# Patient Record
Sex: Male | Born: 1965
Health system: Southern US, Community
[De-identification: ages and names within clinical notes are randomized; demographics above are authoritative.]

## PROBLEM LIST (undated history)

## (undated) DIAGNOSIS — J45909 Unspecified asthma, uncomplicated: Secondary | ICD-10-CM

## (undated) DIAGNOSIS — I10 Essential (primary) hypertension: Secondary | ICD-10-CM

## (undated) DIAGNOSIS — Z9852 Vasectomy status: Secondary | ICD-10-CM

## (undated) DIAGNOSIS — M109 Gout, unspecified: Secondary | ICD-10-CM

## (undated) HISTORY — PX: VASECTOMY: SHX75

## (undated) HISTORY — PX: ANTERIOR CRUCIATE LIGAMENT REPAIR: SHX115

## (undated) HISTORY — PX: TONSILECTOMY, ADENOIDECTOMY, BILATERAL MYRINGOTOMY AND TUBES: SHX2538

## (undated) HISTORY — DX: Essential (primary) hypertension: I10

## (undated) HISTORY — DX: Unspecified asthma, uncomplicated: J45.909

## (undated) HISTORY — DX: Gout, unspecified: M10.9

## (undated) HISTORY — DX: Vasectomy status: Z98.52

---

## 1999-11-12 ENCOUNTER — Encounter: Payer: Self-pay | Admitting: Emergency Medicine

## 1999-11-12 ENCOUNTER — Emergency Department (HOSPITAL_COMMUNITY): Admission: EM | Admit: 1999-11-12 | Discharge: 1999-11-12 | Payer: Self-pay | Admitting: Emergency Medicine

## 2000-10-31 ENCOUNTER — Emergency Department (HOSPITAL_COMMUNITY): Admission: EM | Admit: 2000-10-31 | Discharge: 2000-10-31 | Payer: Self-pay | Admitting: Emergency Medicine

## 2005-06-02 ENCOUNTER — Emergency Department (HOSPITAL_COMMUNITY): Admission: EM | Admit: 2005-06-02 | Discharge: 2005-06-03 | Payer: Self-pay | Admitting: Emergency Medicine

## 2005-12-07 ENCOUNTER — Encounter: Admission: RE | Admit: 2005-12-07 | Discharge: 2005-12-07 | Payer: Self-pay | Admitting: Family Medicine

## 2005-12-07 ENCOUNTER — Ambulatory Visit: Payer: Self-pay | Admitting: Family Medicine

## 2005-12-17 ENCOUNTER — Ambulatory Visit: Payer: Self-pay | Admitting: Cardiology

## 2006-01-15 ENCOUNTER — Ambulatory Visit: Payer: Self-pay | Admitting: Internal Medicine

## 2006-01-15 ENCOUNTER — Ambulatory Visit: Payer: Self-pay

## 2006-02-05 ENCOUNTER — Ambulatory Visit: Payer: Self-pay | Admitting: Internal Medicine

## 2006-10-19 ENCOUNTER — Ambulatory Visit: Payer: Self-pay | Admitting: Family Medicine

## 2006-10-19 ENCOUNTER — Encounter (INDEPENDENT_AMBULATORY_CARE_PROVIDER_SITE_OTHER): Payer: Self-pay | Admitting: Family Medicine

## 2006-10-19 DIAGNOSIS — J309 Allergic rhinitis, unspecified: Secondary | ICD-10-CM | POA: Insufficient documentation

## 2006-10-21 ENCOUNTER — Telehealth (INDEPENDENT_AMBULATORY_CARE_PROVIDER_SITE_OTHER): Payer: Self-pay | Admitting: *Deleted

## 2006-11-24 ENCOUNTER — Ambulatory Visit: Payer: Self-pay | Admitting: Family Medicine

## 2006-11-24 LAB — CONVERTED CEMR LAB: Glucose, Bld: 86 mg/dL (ref 70–99)

## 2006-11-25 ENCOUNTER — Telehealth (INDEPENDENT_AMBULATORY_CARE_PROVIDER_SITE_OTHER): Payer: Self-pay | Admitting: *Deleted

## 2007-02-06 ENCOUNTER — Encounter (INDEPENDENT_AMBULATORY_CARE_PROVIDER_SITE_OTHER): Payer: Self-pay | Admitting: Family Medicine

## 2007-03-07 ENCOUNTER — Ambulatory Visit: Payer: Self-pay | Admitting: Family Medicine

## 2007-03-07 ENCOUNTER — Telehealth (INDEPENDENT_AMBULATORY_CARE_PROVIDER_SITE_OTHER): Payer: Self-pay | Admitting: Family Medicine

## 2007-03-09 ENCOUNTER — Telehealth (INDEPENDENT_AMBULATORY_CARE_PROVIDER_SITE_OTHER): Payer: Self-pay | Admitting: *Deleted

## 2007-04-01 ENCOUNTER — Ambulatory Visit: Payer: Self-pay | Admitting: Family Medicine

## 2007-05-24 ENCOUNTER — Ambulatory Visit: Payer: Self-pay | Admitting: Internal Medicine

## 2007-05-24 DIAGNOSIS — M109 Gout, unspecified: Secondary | ICD-10-CM | POA: Insufficient documentation

## 2007-05-24 HISTORY — DX: Gout, unspecified: M10.9

## 2007-05-26 ENCOUNTER — Encounter (INDEPENDENT_AMBULATORY_CARE_PROVIDER_SITE_OTHER): Payer: Self-pay | Admitting: *Deleted

## 2007-05-26 LAB — CONVERTED CEMR LAB
Basophils Relative: 0.5 % (ref 0.0–1.0)
Eosinophils Relative: 8.3 % — ABNORMAL HIGH (ref 0.0–5.0)
Hemoglobin: 15.3 g/dL (ref 13.0–17.0)
Lymphocytes Relative: 24.1 % (ref 12.0–46.0)
Monocytes Absolute: 0.7 10*3/uL (ref 0.2–0.7)
Neutro Abs: 5.5 10*3/uL (ref 1.4–7.7)
Platelets: 227 10*3/uL (ref 150–400)
Uric Acid, Serum: 8.2 mg/dL — ABNORMAL HIGH (ref 2.4–7.0)
WBC: 9.2 10*3/uL (ref 4.5–10.5)

## 2007-06-08 ENCOUNTER — Ambulatory Visit: Payer: Self-pay | Admitting: Internal Medicine

## 2007-06-08 DIAGNOSIS — R519 Headache, unspecified: Secondary | ICD-10-CM | POA: Insufficient documentation

## 2007-06-08 DIAGNOSIS — R51 Headache: Secondary | ICD-10-CM | POA: Insufficient documentation

## 2007-09-12 ENCOUNTER — Ambulatory Visit: Payer: Self-pay | Admitting: Internal Medicine

## 2008-04-06 ENCOUNTER — Emergency Department (HOSPITAL_COMMUNITY): Admission: EM | Admit: 2008-04-06 | Discharge: 2008-04-06 | Payer: Self-pay | Admitting: Emergency Medicine

## 2008-04-09 ENCOUNTER — Encounter (HOSPITAL_COMMUNITY): Admission: RE | Admit: 2008-04-09 | Discharge: 2008-07-08 | Payer: Self-pay | Admitting: Emergency Medicine

## 2008-07-10 ENCOUNTER — Emergency Department (HOSPITAL_COMMUNITY): Admission: EM | Admit: 2008-07-10 | Discharge: 2008-07-10 | Payer: Self-pay | Admitting: Emergency Medicine

## 2008-08-28 ENCOUNTER — Ambulatory Visit: Payer: Self-pay | Admitting: Family Medicine

## 2008-08-30 ENCOUNTER — Encounter (INDEPENDENT_AMBULATORY_CARE_PROVIDER_SITE_OTHER): Payer: Self-pay | Admitting: *Deleted

## 2008-08-30 LAB — CONVERTED CEMR LAB
AST: 18 units/L (ref 0–37)
Alkaline Phosphatase: 74 units/L (ref 39–117)
Basophils Absolute: 0 10*3/uL (ref 0.0–0.1)
Bilirubin, Direct: 0.1 mg/dL (ref 0.0–0.3)
Calcium: 9 mg/dL (ref 8.4–10.5)
Creatinine, Ser: 0.8 mg/dL (ref 0.4–1.5)
Eosinophils Absolute: 0.2 10*3/uL (ref 0.0–0.7)
GFR calc non Af Amer: 111.9 mL/min (ref >60)
Glucose, Bld: 78 mg/dL (ref 70–99)
HDL: 30.9 mg/dL — ABNORMAL LOW (ref >39.00)
Hemoglobin: 15.3 g/dL (ref 13.0–17.0)
Lymphocytes Relative: 23.5 % (ref 12.0–46.0)
Monocytes Relative: 6.4 % (ref 3.0–12.0)
Neutro Abs: 5.8 10*3/uL (ref 1.4–7.7)
Neutrophils Relative %: 67.2 % (ref 43.0–77.0)
Platelets: 212 10*3/uL (ref 150.0–400.0)
RDW: 13.8 % (ref 11.5–14.6)
Sodium: 143 meq/L (ref 135–145)
Total Bilirubin: 1.3 mg/dL — ABNORMAL HIGH (ref 0.3–1.2)
Triglycerides: 63 mg/dL (ref 0.0–149.0)
VLDL: 12.6 mg/dL (ref 0.0–40.0)

## 2009-03-11 ENCOUNTER — Ambulatory Visit: Payer: Self-pay | Admitting: Family

## 2009-03-13 ENCOUNTER — Ambulatory Visit: Payer: Self-pay | Admitting: Family

## 2010-02-03 ENCOUNTER — Ambulatory Visit: Payer: Self-pay | Admitting: Family Medicine

## 2010-02-03 DIAGNOSIS — R5381 Other malaise: Secondary | ICD-10-CM | POA: Insufficient documentation

## 2010-02-03 DIAGNOSIS — R5383 Other fatigue: Secondary | ICD-10-CM

## 2010-02-07 LAB — CONVERTED CEMR LAB
BUN: 14 mg/dL (ref 6–23)
Basophils Relative: 0.2 % (ref 0.0–3.0)
CO2: 25 meq/L (ref 19–32)
Chloride: 106 meq/L (ref 96–112)
Creatinine, Ser: 0.9 mg/dL (ref 0.4–1.5)
Eosinophils Absolute: 0.5 10*3/uL (ref 0.0–0.7)
Eosinophils Relative: 5.9 % — ABNORMAL HIGH (ref 0.0–5.0)
HCT: 44.3 % (ref 39.0–52.0)
Lymphs Abs: 2.7 10*3/uL (ref 0.7–4.0)
MCHC: 33.6 g/dL (ref 30.0–36.0)
MCV: 83 fL (ref 78.0–100.0)
Monocytes Absolute: 0.6 10*3/uL (ref 0.1–1.0)
Neutrophils Relative %: 56.3 % (ref 43.0–77.0)
Platelets: 207 10*3/uL (ref 150.0–400.0)
Potassium: 4.7 meq/L (ref 3.5–5.1)
TSH: 1.29 microintl units/mL (ref 0.35–5.50)

## 2010-03-28 ENCOUNTER — Telehealth: Payer: Self-pay | Admitting: Family Medicine

## 2010-03-28 ENCOUNTER — Encounter: Payer: Self-pay | Admitting: Family Medicine

## 2010-03-28 ENCOUNTER — Ambulatory Visit
Admission: RE | Admit: 2010-03-28 | Discharge: 2010-03-28 | Payer: Self-pay | Source: Home / Self Care | Attending: Family Medicine | Admitting: Family Medicine

## 2010-03-28 ENCOUNTER — Encounter (INDEPENDENT_AMBULATORY_CARE_PROVIDER_SITE_OTHER): Payer: Self-pay | Admitting: *Deleted

## 2010-03-28 ENCOUNTER — Other Ambulatory Visit: Payer: Self-pay | Admitting: Family Medicine

## 2010-03-28 DIAGNOSIS — D485 Neoplasm of uncertain behavior of skin: Secondary | ICD-10-CM | POA: Insufficient documentation

## 2010-03-28 HISTORY — DX: Neoplasm of uncertain behavior of skin: D48.5

## 2010-03-28 LAB — CBC WITH DIFFERENTIAL/PLATELET
Basophils Absolute: 0 10*3/uL (ref 0.0–0.1)
Basophils Relative: 0.5 % (ref 0.0–3.0)
Eosinophils Absolute: 0.3 10*3/uL (ref 0.0–0.7)
Eosinophils Relative: 3.4 % (ref 0.0–5.0)
HCT: 45.3 % (ref 39.0–52.0)
Hemoglobin: 15.1 g/dL (ref 13.0–17.0)
Lymphocytes Relative: 26.1 % (ref 12.0–46.0)
Lymphs Abs: 2.6 10*3/uL (ref 0.7–4.0)
MCHC: 33.3 g/dL (ref 30.0–36.0)
MCV: 82.6 fl (ref 78.0–100.0)
Monocytes Absolute: 0.8 10*3/uL (ref 0.1–1.0)
Monocytes Relative: 7.7 % (ref 3.0–12.0)
Neutro Abs: 6.2 10*3/uL (ref 1.4–7.7)
Neutrophils Relative %: 62.3 % (ref 43.0–77.0)
Platelets: 251 10*3/uL (ref 150.0–400.0)
RBC: 5.49 Mil/uL (ref 4.22–5.81)
RDW: 13.2 % (ref 11.5–14.6)
WBC: 10 10*3/uL (ref 4.5–10.5)

## 2010-03-28 LAB — HEPATIC FUNCTION PANEL
ALT: 22 U/L (ref 0–53)
AST: 18 U/L (ref 0–37)
Albumin: 3.9 g/dL (ref 3.5–5.2)
Alkaline Phosphatase: 84 U/L (ref 39–117)
Bilirubin, Direct: 0.2 mg/dL (ref 0.0–0.3)
Total Bilirubin: 1.3 mg/dL — ABNORMAL HIGH (ref 0.3–1.2)
Total Protein: 6.1 g/dL (ref 6.0–8.3)

## 2010-03-28 LAB — LIPID PANEL
Cholesterol: 137 mg/dL (ref 0–200)
HDL: 25 mg/dL — ABNORMAL LOW (ref >39.00)
LDL Cholesterol: 98 mg/dL (ref 0–99)
Total CHOL/HDL Ratio: 5
Triglycerides: 69 mg/dL (ref 0.0–149.0)
VLDL: 13.8 mg/dL (ref 0.0–40.0)

## 2010-03-28 LAB — BASIC METABOLIC PANEL
BUN: 19 mg/dL (ref 6–23)
CO2: 28 mEq/L (ref 19–32)
Calcium: 9.2 mg/dL (ref 8.4–10.5)
Chloride: 104 mEq/L (ref 96–112)
Creatinine, Ser: 0.9 mg/dL (ref 0.4–1.5)
GFR: 100.84 mL/min (ref >60.00)
Glucose, Bld: 77 mg/dL (ref 70–99)
Potassium: 4.8 mEq/L (ref 3.5–5.1)
Sodium: 141 mEq/L (ref 135–145)

## 2010-03-28 LAB — TSH: TSH: 0.99 u[IU]/mL (ref 0.35–5.50)

## 2010-03-28 LAB — PSA: PSA: 0.37 ng/mL (ref 0.10–4.00)

## 2010-03-31 ENCOUNTER — Telehealth (INDEPENDENT_AMBULATORY_CARE_PROVIDER_SITE_OTHER): Payer: Self-pay | Admitting: *Deleted

## 2010-04-01 ENCOUNTER — Encounter (INDEPENDENT_AMBULATORY_CARE_PROVIDER_SITE_OTHER): Payer: Self-pay | Admitting: *Deleted

## 2010-04-24 NOTE — Letter (Signed)
Summary: Primary Care Consult Scheduled Letter  [facility]  [address] [place], [place] [address]   Phone: [phone]  Fax: [phone]      04/01/2010 MRN: [id number]  [name] [address] [place], [place]  [address]    Dear Mr. STARON,    We have scheduled an appointment for you.  At the recommendation of Dr. Neena Rhymes, we have scheduled you a consult with Dr. Betsy [facility] on 04-17-2010 at 10:10am.  Their address is [address], [place] [address]. The office phone number is [phone].  If this appointment day and time is not convenient for you, please feel free to call the office of the doctor you are being referred to at the number listed above and reschedule the appointment.    It is important for you to keep your scheduled appointments. We are here to make sure you are given good patient care.   Thank you,    Renee, Patient Care Coordinator [facility]    ****IF YOU ARE UNABLE TO KEEP THIS APPOINTMENT, PLEASE GIVE [facility] A 24 HOUR NOTICE TO AVOID ANY FEE****

## 2010-04-24 NOTE — Assessment & Plan Note (Signed)
Summary: cpx//lch   Vital Signs:  Patient profile:   45 year old male Height:      71 inches Weight:      214 pounds BMI:     29.95 Pulse rate:   74 / minute BP sitting:   104 / 60  (left arm)  Vitals Entered By: Doristine Devoid CMA (March 28, 2010 8:02 AM) CC: CPX AND LAB   History of Present Illness: 45 yo man here today for CPE.  currently has sinus infxn- tx'd at [facility] on 03/23/10, got amox.  no concerns about health today.  Preventive Screening-Counseling & Management  Alcohol-Tobacco     Alcohol drinks/day: <1     Smoking Status: never  Caffeine-Diet-Exercise     Does Patient Exercise: yes     Type of exercise: hiking      Sexual History:  currently monogamous.        Drug Use:  never.    Current Medications (verified): 1)  Symbicort 80-4.5 Mcg/act  Aero (Budesonide-Formoterol Fumarate) .... 2 Puff Bid 2)  Claritin 10 Mg  Tabs (Loratadine) .Marland Kitchen.. 1 By Mouth Once Daily As Needed 3)  Ventolin Hfa 108 (90 Base) Mcg/act Aers (Albuterol Sulfate) .... 2 Puffs Q4 As Needed For Wheezing or Cough  Allergies (verified): No Known Drug Allergies  Past History:  Past medical, surgical, family and social histories (including risk factors) reviewed, and no changes noted (except as noted below).  Past Medical History: Reviewed history from 09/12/2007 and no changes required. Allergic rhinitis Mild COPD Gout   Past Surgical History: Reviewed history from 09/12/2007 and no changes required. Tonsillectomy Knee surgery x 2  (L)  Family History: Reviewed history from 08/28/2008 and no changes required. CAD - MGF (MI age 39's) HTN - M family DM - M family stroke - no colon Ca -uncle (dx'd in 25s) prostate Ca - none  Social History: Reviewed history from 02/03/2010 and no changes required. 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  The patient  denies anorexia, fever, weight loss, weight gain, vision loss, decreased hearing, hoarseness, chest pain, syncope, dyspnea on exertion, peripheral edema, prolonged cough, abdominal pain, melena, hematochezia, severe indigestion/heartburn, suspicious skin lesions, depression, abnormal bleeding, enlarged lymph nodes, and testicular masses.         HAs occur w/ stress 1 episode of hematuria w/ dysuria but sxs improved spontaneously- Oct 2011  Physical Exam  General:  Well-developed,well-nourished,in no acute distress; alert,appropriate and cooperative throughout examination Head:  Normocephalic and atraumatic without obvious abnormalities. No apparent alopecia or balding. Eyes:  PERRL, EOMI, fundi WNL Ears:  External ear exam shows no significant lesions or deformities.  Otoscopic examination reveals clear canals, tympanic membranes are intact bilaterally without bulging, retraction, inflammation or discharge. Hearing is grossly normal bilaterally. Nose:  External nasal examination shows no deformity or inflammation. Nasal mucosa are pink and moist without lesions or exudates. Mouth:  Oral mucosa and oropharynx without lesions or exudates.  Teeth in good repair. Neck:  No deformities, masses, or tenderness noted. Lungs:  Normal respiratory effort, chest expands symmetrically. Lungs are clear to auscultation, no crackles or wheezes. Heart:  Normal rate and regular rhythm. S1 and S2 normal without gallop, murmur, click, rub or other extra sounds. Abdomen:  Bowel sounds positive,abdomen soft and non-tender without masses, organomegaly or hernias noted. Rectal:  No external abnormalities noted.  Normal sphincter tone. No rectal masses or tenderness. Genitalia:  Testes bilaterally descended without nodularity, tenderness or masses. No scrotal masses or lesions. No penis lesions or urethral discharge. No inguinal hernias noted Prostate:  Prostate gland firm and smooth, no enlargement, nodularity,  tenderness, mass, asymmetry or induration. Pulses:  +2 carotid, radial, DP Extremities:  No clubbing, cyanosis, edema, or deformity noted with normal full range of motion of all joints.   Neurologic:  No cranial nerve deficits noted. Station and gait are normal. Plantar reflexes are down-going bilaterally. DTRs are symmetrical throughout. Sensory, motor and coordinative functions appear intact. Skin:  abnormally shaped mole on L back and irregularly pigmented mole on lumbar spine Cervical Nodes:  No lymphadenopathy noted Inguinal Nodes:  No significant adenopathy Psych:  Cognition and judgment appear intact. Alert and cooperative with normal attention span and concentration. No apparent delusions, illusions, hallucinations   Impression & Recommendations:  Problem # 1:  PHYSICAL EXAMINATION (ICD-V70.0) Assessment Unchanged PE WNL w/ exception of irregular moles.  check labs.  EKG done as baseline.  anticipatory guidance provided. Orders: Venipuncture ([address]) TLB-Lipid Panel (80061-LIPID) TLB-BMP (Basic Metabolic Panel-BMET) (80048-METABOL) TLB-CBC Platelet - w/Differential (85025-CBCD) TLB-Hepatic/Liver Function Pnl (80076-HEPATIC) TLB-TSH (Thyroid Stimulating Hormone) (84443-TSH) TLB-PSA (Prostate Specific Antigen) (84153-PSA) EKG w/ Interpretation (93000)  Problem # 2:  NEOPLASM, SKIN, UNCERTAIN BEHAVIOR (ICD-238.2) Assessment: New refer to derm for evaluation and tx. Orders: Dermatology Referral (Derma)  Problem # 3:  NEOPLASM, MALIGNANT, COLON, FAMILY HX (ICD-V16.0) Assessment: New refer to GI Orders: Gastroenterology Referral (GI)  Complete Medication List: 1)  Symbicort 80-4.5 Mcg/act Aero (Budesonide-formoterol fumarate) .... 2 puff bid 2)  Claritin 10 Mg Tabs (Loratadine) .Marland Kitchen.. 1 by mouth once daily as needed 3)  Ventolin Hfa 108 (90 Base) Mcg/act Aers (Albuterol sulfate) .... 2 puffs q4 as needed for wheezing or cough  Patient Instructions: 1)  Follow up in 1 year or  as needed 2)  Your exam looks great- keep up the good work! 3)  We'll notify you of your lab results 4)  Someone will call you with your derm and GI appts 5)  Call with any questions or concerns 6)  Happy New Year!!   Orders Added: 1)  Venipuncture [36415] 2)  TLB-Lipid Panel [80061-LIPID] 3)  TLB-BMP (Basic Metabolic Panel-BMET) [80048-METABOL] 4)  TLB-CBC Platelet - w/Differential [85025-CBCD] 5)  TLB-Hepatic/Liver Function Pnl [80076-HEPATIC] 6)  TLB-TSH (Thyroid Stimulating Hormone) [84443-TSH] 7)  TLB-PSA (Prostate Specific Antigen) [60454-UJW] 8)  Dermatology Referral [Derma] 9)  Gastroenterology Referral [GI] 10)  Est. Patient 40-64 years [99396] 11)  EKG w/ Interpretation [93000]

## 2010-04-24 NOTE — Assessment & Plan Note (Signed)
Summary: headaches//lch  Flu Vaccine Consent Questions     Do you have a history of severe allergic reactions to this vaccine? no    Any prior history of allergic reactions to egg and/or gelatin? no    Do you have a sensitivity to the preservative Thimersol? no    Do you have a past history of Guillan-Barre Syndrome? no    Do you currently have an acute febrile illness? no    Have you ever had a severe reaction to latex? no    Vaccine information given and explained to patient? yes    Are you currently pregnant? no    Lot Number:[id number]   Exp Date:09/20/2010   Site Given  Right Deltoid IM    Vital Signs:  Patient profile:   45 year old male Weight:      223 pounds BMI:     31.21 Temp:     98.1 degrees F oral BP sitting:   120 / 70  (right arm)  Vitals Entered By: Doristine Devoid CMA (February 03, 2010 9:06 AM) CC: HA x2 wks not sure if it's sinuses, hit head around that time could be from that   History of Present Illness: 45 yo man here today for HA.  sxs started 3 weeks ago.  hit top of head getting into the truck- had pain for a few days and then started having daily HAs.  took sinus meds w/out relief.  started restricting food intake in hopes of weight loss- HAs worsened.  hasn't had HA since Thursday.  denied visual changes w/ HA, photo or phonophobia, focal weakness or numbness.  fatigue- sleeping 6-8 hrs nightly but having daytime fatigue.  finding himself nodding off while watching TV in the evening.  wife just finished cancer tx.  having concerns about money.  admits to increased stress.  + family hx of DM.    Current Medications (verified): 1)  Symbicort 80-4.5 Mcg/act  Aero (Budesonide-Formoterol Fumarate) .... 2 Puff Bid 2)  Claritin 10 Mg  Tabs (Loratadine) .Marland Kitchen.. 1 By Mouth Once Daily As Needed 3)  Ventolin Hfa 108 (90 Base) Mcg/act Aers (Albuterol Sulfate) .... 2 Puffs Q4 As Needed For Wheezing or Cough  Allergies (verified): No Known Drug Allergies  Past  History:  Past medical, surgical, family and social histories (including risk factors) reviewed, and no changes noted (except as noted below).  Past Medical History: Reviewed history from 09/12/2007 and no changes required. Allergic rhinitis Mild COPD Gout   Past Surgical History: Reviewed history from 09/12/2007 and no changes required. Tonsillectomy Knee surgery x 2  (L)  Family History: Reviewed history from 08/28/2008 and no changes required. CAD - MGF (MI age 1's) HTN - M family DM - M family stroke - no colon Ca -uncle (dx'd in 37s) prostate Ca - none  Social History: Reviewed history from 03/11/2009 and no changes required. 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 No apparent alopecia or balding.  no TTP over sinuses Eyes:  PERRL, EOMI, fundi WNL Ears:  External ear exam shows no significant lesions or deformities.  Otoscopic examination reveals clear canals, tympanic membranes are intact bilaterally without bulging, retraction, inflammation or discharge. Hearing is grossly normal bilaterally. Nose:  External nasal examination shows no deformity or inflammation. Nasal mucosa are  pink and moist without lesions or exudates. Mouth:  Oral mucosa and oropharynx without lesions or exudates.  Teeth in good repair. Neck:  No deformities, masses, or tenderness noted. Lungs:  Normal respiratory effort, chest expands symmetrically. Lungs are clear to auscultation, no crackles or wheezes. Heart:  Normal rate and regular rhythm. S1 and S2 normal without gallop, murmur, click, rub or other extra sounds. Psych:  Oriented X3, memory intact for recent and remote, normally interactive, and good eye contact.     Impression &  Recommendations:  Problem # 1:  HEADACHE (ICD-784.0) Assessment Unchanged pt has not had HA for last 5 days.  ? whether pt had concussion after hitting head on truck.  pt asymptomatic today.  if HA returns have low threshold for head CT.  will follow.  Problem # 2:  FATIGUE (ICD-780.79) Assessment: New check labs to r/o anemia, thyroid dysfxn, DM (given family hx).  discussed w/ pt that if lab work all normal that we must consider depression as a diagnosis, especially w/ all that is going on (wife's cancer, money concerns, stress at work).  will follow closely. Orders: Venipuncture ([address]) Specimen Handling (60454) TLB-BMP (Basic Metabolic Panel-BMET) (80048-METABOL) TLB-CBC Platelet - w/Differential (85025-CBCD) TLB-TSH (Thyroid Stimulating Hormone) (84443-TSH)  Complete Medication List: 1)  Symbicort 80-4.5 Mcg/act Aero (Budesonide-formoterol fumarate) .... 2 puff bid 2)  Claritin 10 Mg Tabs (Loratadine) .Marland Kitchen.. 1 by mouth once daily as needed 3)  Ventolin Hfa 108 (90 Base) Mcg/act Aers (Albuterol sulfate) .... 2 puffs q4 as needed for wheezing or cough  Other Orders: Admin 1st Vaccine (09811) Flu Vaccine 85yrs + (91478)  Patient Instructions: 1)  Follow up as scheduled for your physical 2)  We'll notify you of your lab work 3)  If we don't identify any causes of fatigue on blood work we need to think about depression as a cause 4)  If you again have headache- please call me.  We will get a CT scan of your head 5)  Call with any questions or concerns 6)  Hang in there!! Prescriptions: SYMBICORT 80-4.5 MCG/ACT  AERO (BUDESONIDE-FORMOTEROL FUMARATE) 2 puff Bid  #1 x 3   Entered and Authorized by:   Neena Rhymes MD   Signed by:   Neena Rhymes MD on 02/03/2010   Method used:   Electronically to        [facility] [id number]* (retail)       [address]       [place], [place]  [address]       Ph: [phone]       Fax: [phone]    RxID:   [id number] VENTOLIN HFA 108 (90 BASE) MCG/ACT AERS (ALBUTEROL SULFATE) 2 puffs Q4 as needed for wheezing or cough  #1 x 3   Entered and Authorized by:   Neena Rhymes MD   Signed by:   Neena Rhymes MD on 02/03/2010   Method used:   Electronically to        [id number]* (retail)       [address]       [place], [place]  [address]       Ph: [phone]       Fax: [phone]   RxID:   [id number]    Orders Added: 1)  Venipuncture [70623] 2)  Specimen Handling [99000] 3)  Admin 1st Vaccine [76283]  4)  Flu Vaccine 73yrs + [90658] 5)  TLB-BMP (Basic Metabolic Panel-BMET) [80048-METABOL] 6)  TLB-CBC Platelet - w/Differential [85025-CBCD] 7)  TLB-TSH (Thyroid Stimulating Hormone) [84443-TSH] 8)  Est. Patient Level III [44010]

## 2010-04-24 NOTE — Progress Notes (Addendum)
Summary: GASTRO REFERRAL  Phone Note Outgoing Call   Call placed by: Magdalen Spatz [facility],  March 28, 2010 10:12 AM Call placed to: Patient Summary of Call: IN REFERENCE TO GASTRO REFERRAL, I CALLED & S/W PT WHO STATES HE DOESN'T WANT THIS REFERRAL RIGHT NOW, WILL PROBABLY WAIT UNTIL NEXT YEAR Magdalen Spatz [facility]  March 28, 2010 10:12 AM   Follow-up for Phone Call        noted Follow-up by: Neena Rhymes MD,  March 28, 2010 10:38 AM     Appended Document: GASTRO REFERRAL patient called back wasn't sure if he needed to go for colonoscopy informed patient that he does due to family hx and patient ok'd information and says that he would call back around the middle of year to get prescription sent up

## 2010-04-24 NOTE — Progress Notes (Signed)
Summary: Change CPAP setting- defer to PCP   Phone Note Call from Patient Call back at [phone]   Caller: Patient Summary of Call: Dr Sherene Sires followed patient when he had hip replaced in August.  Patient feels CPAP machine is set way too high at 2.5, and patient wants it lowered to 1.5.  He wants Dr Sherene Sires to order [facility] to change setting.  [facility] [phone].  Patient wants call back today for feedback. Initial call taken by: Leonette Monarch,  March 31, 2010 1:20 PM  Follow-up for Phone Call        needs to get this through PCP or whoever started CPAP- he was never seen here! LMTCB  Vernie Murders  March 31, 2010 2:24 PM  Spoke with pt.  He is c/o pressure settings too high on his CPAP machine.  I advised that he needs to call and discuss this with his PCP and discuss possible sleep eval? I advised that we can not just change his CPAP settings without seeing him.  He states "I will just go ahead and unplug the machine".  I advised that again, he needs to call his PCP.  Pt hung up the phone. Follow-up by: Vernie Murders,  March 31, 2010 3:13 PM

## 2010-04-24 NOTE — Letter (Signed)
Summary: Pre Visit Letter Revised  [facility]  [address] [place], [place] [address]   Phone: [phone]  Fax: [phone]        03/28/2010 MRN: [id number] [name] [address] [place], [place]  [address]             Procedure Date:  May 01, 2010   dir col-Dr Nedra Hai to the Gastroenterology Division at [facility].    You are scheduled to see a nurse for your pre-procedure visit on April 17, 2010 at 9:30am on the 3rd floor at [facility], [address]  We ask that you try to arrive at our office 15 minutes prior to your appointment time to allow for check-in.  Please take a minute to review the attached form.  If you answer "Yes" to one or more of the questions on the first page, we ask that you call the person listed at your earliest opportunity.  If you answer "No" to all of the questions, please complete the rest of the form and bring it to your appointment.    Your nurse visit will consist of discussing your medical and surgical history, your immediate family medical history, and your medications.   If you are unable to list all of your medications on the form, please bring the medication bottles to your appointment and we will list them.  We will need to be aware of both prescribed and over the counter drugs.  We will need to know exact dosage information as well.    Please be prepared to read and sign documents such as consent forms, a financial agreement, and acknowledgement forms.  If necessary, and with your consent, a friend or relative is welcome to sit-in on the nurse visit with you.  Please bring your insurance card so that we may make a copy of it.  If your insurance requires a referral to see a specialist, please bring your referral form from your primary care physician.  No co-pay is required for this nurse visit.     If you cannot keep your appointment, please call [phone] to cancel or reschedule prior  to your appointment date.  This allows [place] the opportunity to schedule an appointment for another patient in need of care.    Thank you for choosing [facility] for your medical needs.  We appreciate the opportunity to care for you.  Please visit [place] at our website  to learn more about our practice.  Sincerely, The [facility]astroenterology Division

## 2010-08-08 NOTE — Assessment & Plan Note (Signed)
HEALTHCARE                               PULMONARY OFFICE NOTE   NAME:[name], [name]                       MRN:          [id number]  DATE:01/15/2006                            DOB:          December 09, 1965    PROBLEM:  This is a 45 year old nonsmoking white male referred through the  courtesy of Dr. Blossom Hoops complaining about problems with my breathing.  He describes episodic dyspnea over the past two months with a sensation that  he cannot take a comfortable deep breath.  He has had no history of asthma  and has not had any previous recognized respiratory discomfort or complaint.  He was sent for a cardiology workup, that may have been by Dr. Myrtis Ser by the  patient's description.  The patient says that office exam was negative so  the patient cancelled an echocardiogram that the doctor had scheduled.  He  felt better over the last two weekends, making trips up to [place], where he  felt his shortness of breath walking around was consistent with his level of  fitness.  He blames allergies for the fact that he has been more short of  breath since he came back to [place].  He does have a history of seasonal  rhinitis.  He had been given Prevacid which may help some, recognizing a  very occasional episode of heartburn.  There has been a little cough and  wheeze, bringing up small amounts of white sputum, but he has not had fever,  purulent discharge, blood, chest pain, or palpitation.  He notices a little  wheeze lying down at night.  His wife complains of his snoring but this is  not new.  He has not recognized dyspnea related to any work exposure.  He  has been active recently, referring to scouting camping trips, putting up  tents, etc., and says he is on his feet most of the time.  About a week ago  he developed warm tenderness in the left knee which he treated with  ibuprofen.  He has some history of gout which has only involved a toe in the  past.   Over the last 24 hours he has had distinct edema of the left lower  leg with a tight feeling.  He had had some cramping sensation in the left  thigh early in the week.  He has an appointment scheduled for allergy  evaluation with Dr. Stevphen Rochester, not having realized that evaluation  could be done here if needed.   MEDICATIONS:  Prevacid.   ALLERGIES:  NO MEDICATION ALLERGY.   REVIEW OF SYSTEMS:  Weight has been stable.  Some cough with scant white  sputum, wheeze especially when supine, dyspnea mainly characterized as a  sensation that he cannot take a comfortable deep breath as her HPI.  Snoring  with witnessed apneas.  He states he is always tired but says that is  because of his pressured lifestyle with nonrestorative sleep.  No headache  or syncope.  No exertional chest pain.  No adenopathy or rash.   PAST HISTORY:  1.  Hypertension.  2. Seasonal allergic rhinitis.  No history of clotting disorder, heart disease, diabetes, cancer.   PAST SURGERIES:  1. Left ACL.  2. Tonsillectomy.   SOCIAL HISTORY:  Never smoked.  Rare social alcohol.  Married with three  children.  He works in [employer], goes to college full-time, is  involved with scouts.   FAMILY HISTORY:  Son and wife both have asthma but nobody in his family has  asthma.  Others in the family with diabetes and heart disease.   OBJECTIVE:  VITAL SIGNS:  Weight 272 pounds.  Blood pressure 112/82.  Pulse  regular, 61.  Room air saturation 97%.  GENERAL:  This is an obese man with rather pressured speech.  Otherwise,  seems a little tired.  SKIN:  No rash.  ADENOPATHY:  None found.  HEENT:  Palate length 3/4.  Moderate nasal congestion bilaterally with clear  secretion.  No pharyngeal erythema.  NECK:  No neck vein distention, stridor, or thyromegaly.  CHEST:  Quiet, clear breath sounds.  I do not hear rales, rubs, or wheeze.  He is not coughing and breathing is not labored.  HEART:  Regular rhythm.   Normal S1, S2.  No murmur or gallop.  ABDOMEN:  Softly obese.  No definite organomegaly.  EXTREMITIES:  Left knee is distinctly warm and slightly reddened.  There is  definite edema of the left lower leg.  The calf is nontender.  Homan's is  negative.  There is a blanching superficial varix on the left pretibial  area.  No cyanosis or clubbing.   IMPRESSION:  1. Nonspecific dyspnea over the past two months with the sensation that he      cannot take a deep breath.  Normal oxygenation, nonspecific.  NOTE:  A      chest x-ray report dated December 07, 2005, was negative.  We will      want pulmonary function tests.  2. Arthritis, left knee, with a history of gout in the toe, suggesting      this may be arthritis.  3. Edema, left lower leg, very high risk for deep vein thrombosis inviting      the possibility that his episodic dyspnea over the last 2 months has      been because of recurrent pulmonary embolism.  I have discussed this      with the patient and we are pushing to get CT, Dopplers, and a D-DIMER      done today to exclude this possibility.  4. I think seasonal allergic rhinitis which can be evaluated here for      allergy evaluation as needed or if he prefers he can keep his      appointment to be evaluated elsewhere.  5. Probable obstructive sleep apnea.  6. Poor sleep hygiene with inadequate sleep.   PLAN:  1. The first priority is to exclude deep vein thrombosis and pulmonary      embolism so we are getting blood drawn for a CBC with diff, basic      metabolic panel, and D-DIMER; scheduling Dopplers of leg veins and a      contrast CT to exclude pulmonary embolism.  These are on schedule to be      done today.  2. While blood is being drawn, we will check a uric acid level for      possibility that he has acute gouty arthritis, left knee.  3. The CT scan of the chest will also  help with evaluation of his dyspnea     but we are scheduling pulmonary function tests.  4.  Schedule return here one week.  Less acute problems can be sorted out      secondarily.   I appreciate the chance to meet this gentleman.     Clinton D. Maple Hudson, MD, [facility], FACP    CDY/MedQ  DD: 01/15/2006  DT: 01/16/2006  Job #: [id number]   cc:   Leanne Chang, M.D.

## 2010-08-08 NOTE — Assessment & Plan Note (Signed)
[facility]                          [place] OFFICE NOTE   NAME:[name]                       MRN:          [id number]  DATE:12/07/2005                            DOB:          1966/01/21    REASON FOR VISIT:  Trouble breathing.   Mr. [name] is a 45 year old male who reports that over the last 2-3 months he  has been having difficulties with dyspnea on exertion.  He reports that when  he goes up the steps, by the time he gets to the last level he gets very  winded.  He has noticed shortness of breath after eating as well.  He  reports that he is not physically active but has never had this in the past.  The last couple days he has been having a cough and congestion but relates  that to allergies which he started taking Claritin p.r.n.  Upon further  questioning the patient did report intermittent episodes of chest pain.  He  described it as sharp, can last several hours.  It has not affected him from  doing his usual activity.  He denied any diaphoresis, numbness, tingling or  associated shortness of breath with the episodes.   In regards to his shortness of breath associated with eating, Mr. [name]  reports that he feels like his food does not go completely down, it stops in  mid chest.  After several hours the symptoms resolve.   PAST MEDICAL HISTORY:  None.   SURGICAL HISTORY:  1. Tonsillectomy.  2. Cartilage repair of the left knee.  3. ACL repair of the left knee.   MEDICATIONS:  None, except for Claritin p.r.n.   ALLERGIES:  No known drug allergies.   FAMILY HISTORY:  Father alive with a history of hypertension.  Mother alive  with a history of type 2 diabetes.  He has two brothers who are alive and  well with no significant medical problems.   SOCIAL HISTORY:  Patient works in [employer].  Married with 3  children.  Denies any tobacco use and drinks alcohol very rarely.   REVIEW OF SYSTEMS:  As per HPI,  otherwise unremarkable.   OBJECTIVE:  VITAL SIGNS:  Weight 271.  Temperature 98.3.  Pulse 72.  Blood  pressure 110/66. Pulse oximetry of 96% on room air.  GENERAL:  We have a pleasant over weight male in no acute distress.  Answers  questions appropriately.  Alert and oriented x3.  HEENT:  Nasal mucosa slightly boggy with clear nasal discharge.  Oropharynx  is benign.  NECK:  Was supple.  No lymphadenopathy, carotid bruits, no JVD, no  thyromegaly or nodules noted.  LUNGS:  Clear with good air movement.  HEART:  With regular rate and rhythm.  Normal S1 and S2.  No murmurs,  gallops or rubs heard on my examination.  EXTREMITIES:  No cyanosis, clubbing or edema.   EKG:  Showed normal sinus rhythm with no ST elevations or depression. No Q  waves.  No PVCs or PACs.  No LVH or RBH noted.   IMPRESSION:  A 45 year old  male presenting with 2-3 months history of  dyspnea on exertion and shortness of breath after eating.  This has been  complicated with intermittent chest pain and fatigue.  Etiology includes  deconditioning, cardiovascular disease, sleep apnea and pulmonary disease,  although unlikely given the history of no asthma or tobacco use.   PLAN:  1. We will refer patient to cardiology for further assessment.  2. We will obtain a chest x-ray, CBC, comprehensive metabolic profile, TSH      as well as an H. pylori.  3. We will refer patient for a sleep study.  4. We will start patient empirically on Prevacid 30 mg q. a.m.  5. Reviewed diet changes with patient.  6. He needs to follow up with me in 4 weeks or sooner if symptoms worsen.  7. I agreed with patient using Claritin daily.                                   Leanne Chang, M.D.   LA/MedQ  DD:  12/07/2005  DT:  12/07/2005  Job #:  [id number]

## 2010-08-08 NOTE — Assessment & Plan Note (Signed)
[facility]                              CARDIOLOGY OFFICE NOTE   NAME:Ganci, KESTON SEEVER                       MRN:          [id number]  DATE:12/17/2005                            DOB:          25-Apr-1965    Mr. Paquette is evaluated for his shortness of breath. He is active. He is  significantly overweight. He has noted shortness of breath at various times.  If he walks he has some shortness of breath. He has not had any definite PND  or orthopnea. He also may feel short of breath somewhat just after eating.  He has not had syncope or presyncope. He has not had a cough. He does snore  and there is question of sleep apnea. He is referred for further evaluation.   ALLERGIES:  No known drug allergies.   MEDICATIONS:  1. Prevacid started recently.   PAST MEDICAL HISTORY:  Other medical problems:  See the list below.   SOCIAL HISTORY:  The patient works.   FAMILY HISTORY:  There is no strong family history of coronary disease.   REVIEW OF SYSTEMS:  The patient has some allergic symptoms. He does have  shortness of breath as mentioned. He also has some fatigue. Otherwise his  review of systems is negative.   PHYSICAL EXAMINATION:  VITAL SIGNS:  The patient weighs 272 pounds. His  blood pressure is 140/87. His resting pulse is 95.  LUNGS:  Clear. Respiratory effort is not labored.  HEENT:  Reveals no xanthelasma. He has normal extraocular motion.  NECK:  There are no carotid bruits. There is no jugular venous distention.  CARDIAC:  Exam reveals an S1 and S2. There are no clicks or significant  murmurs.  ABDOMEN:  Obese. There are no masses or bruits in his abdomen.  EXTREMITIES:  He has no significant peripheral edema.  MUSCULOSKELETAL:  There are no major musculoskeletal deformities.   EKG is normal. Chest x-ray has been read as normal. Labs done have shown  that hemoglobin is 15. BUN is 9 with a creatinine of 1.   PROBLEMS:  1. History of snoring  with possible sleep apnea and I know that a sleep      study is being arranged.  2. Possibility of some gastroesophageal reflux disease. Prevacid seems to      be helping him.  3. Shortness of breath. At this point the shortness of breath does not      sound like angina. However this will be kept in mind. Also there are no      overt signs of congestive heart failure. We will proceed with a 2-D      echocardiogram to be sure that he has no normal left ventricular      function and to be sure that there are no occult valvular      abnormalities. I plan to      see him back. Will consider an exercise test if the issue persists. I      do believe that a sleep study is a very good idea for him.  ______________________________  Luis Abed, MD, [facility]     JDK/MedQ  DD:  12/17/2005  DT:  12/19/2005  Job #:  [id number]   cc:   Leanne Chang, M.D.

## 2011-02-10 ENCOUNTER — Ambulatory Visit: Payer: Self-pay | Admitting: Family Medicine

## 2011-03-12 ENCOUNTER — Encounter: Payer: Self-pay | Admitting: Family Medicine

## 2011-03-12 ENCOUNTER — Ambulatory Visit (INDEPENDENT_AMBULATORY_CARE_PROVIDER_SITE_OTHER): Payer: 59 | Admitting: Family Medicine

## 2011-03-12 DIAGNOSIS — J45909 Unspecified asthma, uncomplicated: Secondary | ICD-10-CM

## 2011-03-12 DIAGNOSIS — Z0000 Encounter for general adult medical examination without abnormal findings: Secondary | ICD-10-CM

## 2011-03-12 LAB — POCT URINALYSIS DIPSTICK
Bilirubin, UA: NEGATIVE
Glucose, UA: NEGATIVE
Ketones, UA: NEGATIVE
Leukocytes, UA: NEGATIVE
pH, UA: 5

## 2011-03-12 LAB — LIPID PANEL
Cholesterol: 126 mg/dL (ref 0–200)
VLDL: 9.8 mg/dL (ref 0.0–40.0)

## 2011-03-12 LAB — TSH: TSH: 0.76 u[IU]/mL (ref 0.35–5.50)

## 2011-03-12 LAB — CBC WITH DIFFERENTIAL/PLATELET
Basophils Absolute: 0 10*3/uL (ref 0.0–0.1)
Eosinophils Relative: 2.1 % (ref 0.0–5.0)
HCT: 43.9 % (ref 39.0–52.0)
Lymphs Abs: 2.4 10*3/uL (ref 0.7–4.0)
MCV: 85.3 fl (ref 78.0–100.0)
Monocytes Absolute: 0.7 10*3/uL (ref 0.1–1.0)
Neutrophils Relative %: 67.9 % (ref 43.0–77.0)
Platelets: 247 10*3/uL (ref 150.0–400.0)
RDW: 13.9 % (ref 11.5–14.6)
WBC: 10.5 10*3/uL (ref 4.5–10.5)

## 2011-03-12 LAB — BASIC METABOLIC PANEL
Calcium: 9.1 mg/dL (ref 8.4–10.5)
GFR: 106.02 mL/min (ref >60.00)
Potassium: 4.2 mEq/L (ref 3.5–5.1)
Sodium: 144 mEq/L (ref 135–145)

## 2011-03-12 LAB — HEPATIC FUNCTION PANEL
ALT: 29 U/L (ref 0–53)
Albumin: 4.2 g/dL (ref 3.5–5.2)
Total Bilirubin: 1.2 mg/dL (ref 0.3–1.2)

## 2011-03-12 MED ORDER — BUDESONIDE-FORMOTEROL FUMARATE 80-4.5 MCG/ACT IN AERO: 2.0000 | INHALATION_SPRAY | Freq: Two times a day (BID) | RESPIRATORY_TRACT | Status: DC

## 2011-03-12 MED ORDER — ALBUTEROL SULFATE HFA 108 (90 BASE) MCG/ACT IN AERS: 2.0000 | INHALATION_SPRAY | RESPIRATORY_TRACT | Status: DC | PRN

## 2011-03-12 NOTE — Progress Notes (Signed)
  Subjective:    Patient ID: [name], male    DOB: [birth year], 45 y.o.   MRN: [id number]  HPI CPE- no concerns today.  Needs forms completed for [employer] and [employer]   Review of Systems Patient reports no vision/hearing changes, anorexia, fever ,adenopathy, persistant/recurrent hoarseness, swallowing issues, chest pain, palpitations, edema, persistant/recurrent cough, hemoptysis, dyspnea (rest,exertional, paroxysmal nocturnal), gastrointestinal  bleeding (melena, rectal bleeding), abdominal pain, excessive heart burn, GU symptoms (dysuria, hematuria, voiding/incontinence issues) syncope, focal weakness, memory loss, numbness & tingling, skin/hair/nail changes, depression, anxiety, abnormal bruising/bleeding, musculoskeletal symptoms/signs.     Objective:   Physical Exam BP 130/70  Pulse 80  Temp(Src) 97.8 F (36.6 C) (Oral)  Ht 5' 10.25" (1.784 m)  Wt 212 lb 6.4 oz (96.344 kg)  BMI 30.26 kg/m2  SpO2 98%  General Appearance:    Alert, cooperative, no distress, appears stated age  Head:    Normocephalic, without obvious abnormality, atraumatic  Eyes:    PERRL, conjunctiva/corneas clear, EOM's intact, fundi    benign, both eyes       Ears:    Normal TM's and external ear canals, both ears  Nose:   Nares normal, septum midline, mucosa normal, no drainage   or sinus tenderness  Throat:   Lips, mucosa, and tongue normal; teeth and gums normal  Neck:   Supple, symmetrical, trachea midline, no adenopathy;       thyroid:  No enlargement/tenderness/nodules  Back:     Symmetric, no curvature, ROM normal, no CVA tenderness  Lungs:     Clear to auscultation bilaterally, respirations unlabored  Chest wall:    No tenderness or deformity  Heart:    Regular rate and rhythm, S1 and S2 normal, no murmur, rub   or gallop  Abdomen:     Soft, non-tender, bowel sounds active all four quadrants,    no masses, no organomegaly  Genitalia:    Normal male without lesion, discharge or tenderness    Rectal:    Normal tone, normal prostate, no masses or tenderness  Extremities:   Extremities normal, atraumatic, no cyanosis or edema  Pulses:   2+ and symmetric all extremities  Skin:   Skin color, texture, turgor normal, no rashes or lesions  Lymph nodes:   Cervical, supraclavicular, and axillary nodes normal  Neurologic:   CNII-XII intact. Normal strength, sensation and reflexes      throughout          Assessment & Plan:

## 2011-03-12 NOTE — Patient Instructions (Signed)
Follow up in 1 year- sooner if needed We'll notify you of your lab results Call with any questions or concerns Keep up the good work! Happy Holidays!!!

## 2011-03-18 ENCOUNTER — Encounter: Payer: Self-pay | Admitting: *Deleted

## 2011-03-29 NOTE — Assessment & Plan Note (Signed)
Refills provided on inhalers 

## 2011-03-29 NOTE — Assessment & Plan Note (Addendum)
Pt's PE WNL.  Check labs.  Anticipatory guidance provided.  DOT card and [employer] forms completed

## 2011-09-29 ENCOUNTER — Ambulatory Visit
Admission: RE | Admit: 2011-09-29 | Discharge: 2011-09-29 | Disposition: A | Discharge status: Home / Self Care | Payer: 59 | Source: Ambulatory Visit | Attending: Internal Medicine | Admitting: Internal Medicine

## 2011-09-29 ENCOUNTER — Encounter: Payer: Self-pay | Admitting: Internal Medicine

## 2011-09-29 ENCOUNTER — Other Ambulatory Visit: Payer: Self-pay | Admitting: *Deleted

## 2011-09-29 ENCOUNTER — Ambulatory Visit (INDEPENDENT_AMBULATORY_CARE_PROVIDER_SITE_OTHER): Payer: 59 | Admitting: Internal Medicine

## 2011-09-29 VITALS — BP 126/74 | HR 84 | Temp 97.9°F | Wt 226.0 lb

## 2011-09-29 DIAGNOSIS — N50819 Testicular pain, unspecified: Secondary | ICD-10-CM

## 2011-09-29 DIAGNOSIS — N509 Disorder of male genital organs, unspecified: Secondary | ICD-10-CM

## 2011-09-29 LAB — POCT URINALYSIS DIPSTICK
Bilirubin, UA: NEGATIVE
Glucose, UA: NEGATIVE
Leukocytes, UA: NEGATIVE
Nitrite, UA: NEGATIVE

## 2011-09-29 MED ORDER — HYDROCODONE-ACETAMINOPHEN 7.5-750 MG PO TABS: 1.0000 | ORAL_TABLET | Freq: Three times a day (TID) | ORAL | Status: AC | PRN

## 2011-09-29 MED ORDER — LEVOFLOXACIN 500 MG PO TABS: 500.0000 mg | ORAL_TABLET | Freq: Every day | ORAL | Status: AC

## 2011-09-29 NOTE — Progress Notes (Signed)
Per [facility], must have this order to complete original order placed; MD informed/SLS

## 2011-09-29 NOTE — Progress Notes (Signed)
  Subjective:    Patient ID: [name], male    DOB: Apr 18, 1965, 46 y.o.   MRN: [id number]  HPI Acute visit, symptoms started yesterday: Acute onset of left testicular pain with some radiation to the left groin , pain increased with walking, decrease with rest but these is steady. No associated symptoms, no recent genital injury.   Past Medical History  Diagnosis Date  . Asthma   . Gout   . H/O vasectomy    Past Surgical History  Procedure Date  . Anterior cruciate ligament repair   . Tonsilectomy, adenoidectomy, bilateral myringotomy and tubes   . Vasectomy     , Review of Systems  no fever or chills No dysuria, blood in the urine or difficulty urinating. No rash anywhere in the genital area or abdomen.  No flank pain, no mass in the groin.Appetite is normal, no nausea or vomiting.     Objective:   Physical Exam   alert oriented x3, gait is antalgic due to pain. Abdomen not distended, soft, nontender, good bowel sounds. Groin: Normal to inspection and palpation, normal femoral pulses. No hernia with Valsalva maneuver. GU: Penis normal, no discharge or rash. Right testicle normal to palpation. Left testicle diffusely swollen, exquisitely tender, epididymus seemed enlarged as well. Quite tender. No genital rash.       Assessment & Plan:

## 2011-09-29 NOTE — Assessment & Plan Note (Addendum)
One-day history of left testicular pain, review of systems negative for urethritis or prostatitis type of sx. No hernia on exam. Most likely symptoms due to to epididymitis and/or orchitis Udip (-) Plan: UCX, G&C Empiric antibiotics, levaquin Ultrasound today to rule out torsion Further advice with results Pain control with scrotal elevation and Vicodin ER if symptoms severe.

## 2011-09-29 NOTE — Patient Instructions (Addendum)
    Orchitis Orchitis is an infection of the testicle of usually sudden onset (happens quickly). It may be viral or bacterial (caused by germs). Usually with this illness there is generalized malaise (not feeling well) and fever. There is also pain. There is usually tenderness and swelling of the scrotum and testicle. DIAGNOSIS  Your caregiver will perform an exam to make sure there is not another reason for the pain in your testicle. A rectal exam may be done to find out if the prostate is swollen and tender. Blood work may be done to see if your white blood cell count is elevated. This can help determine if an infection is viral or bacterial. A urinalysis can also determine what type of infection is present. Most bacterial infections can be treated with antibiotics (medications which kill germs). LET YOUR CAREGIVER KNOW ABOUT:  Allergies.   Medications taken including herbs, eye drops, over the counter medications, and creams.   Use of steroids (by mouth or creams).   Previous problems with anesthetics or novocaine.   Previous prostate infections.   History of blood clots (thrombophlebitis).   History of bleeding or blood problems.   Previous surgery.   Previous urinary tract infection.   Other health problems.  HOME CARE INSTRUCTIONS   Apply cold packs to the scrotal area for twenty minutes, four times per day or as needed.   A scrotal support may be helpful. Keep a small pillow or support under your testicles while lying or sitting down.   Only take over-the-counter or prescription medicines for pain, discomfort, or fever as directed by your caregiver.   Take all medications, including antibiotics, as directed. Take the antibiotics for the full prescribed length of time even if you are feeling better.  SEEK IMMEDIATE MEDICAL CARE IF:   Your redness, swelling, or pain in the testicle increases or is not getting better.   You have a fever.   You have pain not relieved with  medicines.   You have any worsening of any symptoms (problems) that originally brought you in for medical care.  Document Released: 03/06/2000 Document Revised: 02/26/2011 Document Reviewed: 03/09/2005 [facility] Patient Information 2012 [place], [place].   Get the ultrasound done today  Diagnosis--Epididymitis, orchitis Take antibiotics as prescribed for 10 days Scrotal elevation Pain control with Vicodin, will cause drowsiness and possible constipation ER if you feel worse, you have high fever or your not improving in the next few days

## 2011-09-30 LAB — GC/CHLAMYDIA PROBE AMP, URINE: Chlamydia, Swab/Urine, PCR: NEGATIVE

## 2011-10-01 LAB — URINE CULTURE

## 2011-10-02 ENCOUNTER — Telehealth: Payer: Self-pay | Admitting: *Deleted

## 2011-10-02 NOTE — Telephone Encounter (Signed)
Left msg on pt's vmail to check & see if he was feeling any better.

## 2011-10-06 NOTE — Telephone Encounter (Signed)
Spoke with the patient, pain has subsided, I recommend to  finish the antibiotics and call if symptoms resurface

## 2012-02-19 ENCOUNTER — Other Ambulatory Visit: Payer: Self-pay

## 2012-02-19 MED ORDER — BUDESONIDE-FORMOTEROL FUMARATE 80-4.5 MCG/ACT IN AERO: 2.0000 | INHALATION_SPRAY | Freq: Two times a day (BID) | RESPIRATORY_TRACT | Status: DC

## 2012-02-19 MED ORDER — ALBUTEROL SULFATE HFA 108 (90 BASE) MCG/ACT IN AERS: 2.0000 | INHALATION_SPRAY | RESPIRATORY_TRACT | Status: DC | PRN

## 2012-02-19 NOTE — Telephone Encounter (Signed)
Rx sent.    MW 

## 2012-02-19 NOTE — Telephone Encounter (Signed)
Rx done twice to e prescribe and make sure done correctly. Pt aware Rx sent inhalers sent  MW

## 2012-07-08 ENCOUNTER — Encounter (HOSPITAL_COMMUNITY): Payer: Self-pay | Admitting: Emergency Medicine

## 2012-07-08 ENCOUNTER — Emergency Department (HOSPITAL_COMMUNITY)
Admission: EM | Admit: 2012-07-08 | Discharge: 2012-07-08 | Disposition: A | Discharge status: Home / Self Care | Payer: Medicaid Other | Attending: Emergency Medicine | Admitting: Emergency Medicine

## 2012-07-08 DIAGNOSIS — M5380 Other specified dorsopathies, site unspecified: Secondary | ICD-10-CM | POA: Insufficient documentation

## 2012-07-08 DIAGNOSIS — M62830 Muscle spasm of back: Secondary | ICD-10-CM

## 2012-07-08 DIAGNOSIS — Z862 Personal history of diseases of the blood and blood-forming organs and certain disorders involving the immune mechanism: Secondary | ICD-10-CM | POA: Insufficient documentation

## 2012-07-08 DIAGNOSIS — M545 Low back pain, unspecified: Secondary | ICD-10-CM | POA: Insufficient documentation

## 2012-07-08 DIAGNOSIS — Z79899 Other long term (current) drug therapy: Secondary | ICD-10-CM | POA: Insufficient documentation

## 2012-07-08 DIAGNOSIS — J45909 Unspecified asthma, uncomplicated: Secondary | ICD-10-CM | POA: Insufficient documentation

## 2012-07-08 DIAGNOSIS — Z8639 Personal history of other endocrine, nutritional and metabolic disease: Secondary | ICD-10-CM | POA: Insufficient documentation

## 2012-07-08 MED ORDER — IBUPROFEN 800 MG PO TABS: 800.0000 mg | ORAL_TABLET | Freq: Three times a day (TID) | ORAL | Status: DC

## 2012-07-08 MED ORDER — CYCLOBENZAPRINE HCL 10 MG PO TABS: 10.0000 mg | ORAL_TABLET | Freq: Two times a day (BID) | ORAL | Status: DC | PRN

## 2012-07-08 MED ORDER — KETOROLAC TROMETHAMINE 60 MG/2ML IM SOLN
60.0000 mg | Freq: Once | INTRAMUSCULAR | Status: AC
  Administered 2012-07-08: 60 mg via INTRAMUSCULAR
  Filled 2012-07-08: qty 2

## 2012-07-08 MED ORDER — CYCLOBENZAPRINE HCL 10 MG PO TABS
10.0000 mg | ORAL_TABLET | Freq: Once | ORAL | Status: AC
  Administered 2012-07-08: 10 mg via ORAL
  Filled 2012-07-08: qty 1

## 2012-07-08 NOTE — ED Notes (Signed)
Pt reports 2 day hx of recurrent low back spasm. Denies trauma. Pain unresponsive to OTC meds

## 2012-07-08 NOTE — ED Provider Notes (Signed)
History    This chart was scribed for non-physician practitioner, Junius Finner, PA-C, working with Suzi Roots, MD by Melba Coon, ED Scribe. This patient was seen in room WTR7/WTR7 and the patient's care was started at 4:42PM.   CSN: [id number]  Arrival date & time 07/08/12  1552   None     Chief Complaint  Patient presents with  . Back Pain    low back spasms x 2 days    (Consider location/radiation/quality/duration/timing/severity/associated sxs/prior treatment) The history is provided by the patient. No language interpreter was used.   ERIBERTO FELCH is a 47 y.o. male who presents to the Emergency Department complaining of constant, moderate to severe, mid back pain with an onset 2 days ago.  He reports a history of recurrent back spasms since this past summer. He denies recent known injury or trauma to the back. He reports his back pain radiates to bilateral legs without numbness or tingling. Vicodin and OTC pain medications do not alleviate the pain; last dose was yesterday. He reports that he has taken flexeril in the past and it has alleviated the pain. Sitting down and walking aggravates the pain. He reports that he is starting an Print production planner position at [employer] and reports that he needs to be able to move around on the job. He wants to attend PT and was going to go to his PCP today but they are closed today due to the Good Friday Holiday. Denies HA, fever, neck pain, sore throat, rash, CP, SOB, abd pain, nausea, emesis, diarrhea, dysuria, hematuria, bowel or bladder dysfunction, or extremity edema and weakness. No IV drug abuse. No known allergies. No other pertinent medical symptoms.  Past Medical History  Diagnosis Date  . Asthma   . Gout   . H/O vasectomy     Past Surgical History  Procedure Laterality Date  . Anterior cruciate ligament repair    . Tonsilectomy, adenoidectomy, bilateral myringotomy and tubes    . Vasectomy      Family History   Problem Relation Age of Onset  . Hypertension Mother   . Diabetes Mother   . Cancer Mother   . Diabetes Father   . Hypertension Father     History  Substance Use Topics  . Smoking status: Never Smoker   . Smokeless tobacco: Not on file  . Alcohol Use: Yes    Review of Systems 10 Systems reviewed and all are negative for acute change except as noted in the HPI.   Allergies  Review of patient's allergies indicates no known allergies.  Home Medications   Current Outpatient Rx  Name  Route  Sig  Dispense  Refill  . HYDROcodone-acetaminophen (NORCO/VICODIN) 5-325 MG per tablet   Oral   Take 1 tablet by mouth every 6 (six) hours as needed for pain.         Marland Kitchen loratadine (CLARITIN) 10 MG tablet   Oral   Take 10 mg by mouth daily.         . cyclobenzaprine (FLEXERIL) 10 MG tablet   Oral   Take 1 tablet (10 mg total) by mouth 2 (two) times daily as needed for muscle spasms.   20 tablet   0   . ibuprofen (ADVIL,MOTRIN) 800 MG tablet   Oral   Take 1 tablet (800 mg total) by mouth 3 (three) times daily.   21 tablet   0     BP 131/88  Pulse 94  Temp(Src) 98.4 F (  36.9 C) (Oral)  Resp 18  SpO2 95%  Physical Exam  Nursing note and vitals reviewed. Constitutional:  Awake, alert, nontoxic appearance with baseline speech.  HENT:  Head: Atraumatic.  Eyes: Pupils are equal, round, and reactive to light. Right eye exhibits no discharge. Left eye exhibits no discharge.  Neck: Neck supple.  Cardiovascular: Normal rate and regular rhythm.   No murmur heard. Pulmonary/Chest: Effort normal and breath sounds normal. No respiratory distress. He has no wheezes. He has no rales. He exhibits no tenderness.  Abdominal: Soft. Bowel sounds are normal. He exhibits no mass. There is no tenderness. There is no rebound.  Musculoskeletal: Normal range of motion. He exhibits tenderness.       Thoracic back: He exhibits no tenderness.       Lumbar back: He exhibits no tenderness.   TTP lower lumbar region with some paraspinal muscular tenderness. Gait somewhat antalgic but without apparent new ataxia.  Neurological:  Mental status baseline for patient.  Upper extremity motor strength and sensation intact and symmetric bilaterally.  Skin: No rash noted.  Psychiatric: He has a normal mood and affect.    ED Course  Procedures (including critical care time)  DIAGNOSTIC STUDIES: Oxygen Saturation is 95% on room air, adequate by my interpretation.    COORDINATION OF CARE:  4:47PM - flexeril and toradol will be ordered for Pearly [place]. Flexeril will be prescribed. He is advised to f/u with his PCP to set up PT. He is ready for d/c.   Labs Reviewed - No data to display No results found.   1. LBP (low back pain)   2. Muscle spasm of back       MDM  Pt c/o back pain x2 days unrelieved by vicodin or OTC pain meds.  Reports previous flare ups of muscle spasm, back pain since last summer.  Flexeril has worked in the past.  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  Does not recall any specific injury that caused current pain.  Denies fever, n/v/d, saddle paraesthesia, change in bowel or bladder.   Antalgic gait but not apparent new ataxia.  Pt able to rotate at hips, but flexion at waist exacerbates pain.  UE and LE strength in tact and symmetric bilat. No CVAT.  PE: ttp along lower lumbar region and paraspinal muscles.   No imaging necessary at this time.    Advised pt to f/u with PCP for PT referral.    Tx in ED: torodal and flexeril.  Pt's wife was present and stated she will be driving home.  Rx: ibuprofen and flexeril.  Advised pt not to drive while taking flexeril.  I personally performed the services described in this documentation, which was scribed in my presence. The recorded information has been reviewed and is accurate.        Junius Finner, PA-C 07/08/12 1712

## 2012-07-09 NOTE — ED Provider Notes (Signed)
Medical screening examination/treatment/procedure(s) were performed by non-physician practitioner and as supervising physician I was immediately available for consultation/collaboration.   Mikaya Bunner E Izac Faulkenberry, MD 07/09/12 1202 

## 2012-08-02 ENCOUNTER — Ambulatory Visit: Payer: 59 | Admitting: Family Medicine

## 2012-10-31 ENCOUNTER — Ambulatory Visit (INDEPENDENT_AMBULATORY_CARE_PROVIDER_SITE_OTHER): Payer: Medicaid Other | Admitting: Family Medicine

## 2012-10-31 ENCOUNTER — Encounter: Payer: Self-pay | Admitting: Family Medicine

## 2012-10-31 VITALS — BP 122/80 | HR 88 | Temp 98.4°F | Ht 70.25 in | Wt 244.2 lb

## 2012-10-31 DIAGNOSIS — R5383 Other fatigue: Secondary | ICD-10-CM

## 2012-10-31 DIAGNOSIS — R5381 Other malaise: Secondary | ICD-10-CM

## 2012-10-31 DIAGNOSIS — R6882 Decreased libido: Secondary | ICD-10-CM

## 2012-10-31 HISTORY — DX: Decreased libido: R68.82

## 2012-10-31 MED ORDER — ALBUTEROL SULFATE HFA 108 (90 BASE) MCG/ACT IN AERS: 2.0000 | INHALATION_SPRAY | Freq: Four times a day (QID) | RESPIRATORY_TRACT | Status: DC | PRN

## 2012-10-31 MED ORDER — BUDESONIDE-FORMOTEROL FUMARATE 80-4.5 MCG/ACT IN AERO: 2.0000 | INHALATION_SPRAY | Freq: Two times a day (BID) | RESPIRATORY_TRACT | Status: DC

## 2012-10-31 NOTE — Patient Instructions (Addendum)
We'll notify you of your lab results and make any changes if needed If all labs look ok, this could be a manifestation of depression Please call with any questions or concerns Hang in there and good luck!

## 2012-10-31 NOTE — Progress Notes (Signed)
  Subjective:    Patient ID: [name], male    DOB: Jul 03, 1965, 47 y.o.   MRN: [id number]  HPI Fatigue- 'tired all the time'.  sxs started 6-8 months ago.  Pt feels he is sleeping well at night.  Gets nearly 7 hrs of sleep/night.  + increased stress, lost job in Oct.  Wife has not worked in 15 yrs.  Pt is working through a [employer] and income isn't steady.  Some decreased sex drive.     Review of Systems For ROS see HPI     Objective:   Physical Exam  Vitals reviewed. Constitutional: He is oriented to person, place, and time. He appears well-developed and well-nourished. No distress.  HENT:  Head: Normocephalic and atraumatic.  Eyes: Conjunctivae and EOM are normal. Pupils are equal, round, and reactive to light.  Neck: Normal range of motion. Neck supple. No thyromegaly present.  Cardiovascular: Normal rate, regular rhythm, normal heart sounds and intact distal pulses.   No murmur heard. Pulmonary/Chest: Effort normal and breath sounds normal. No respiratory distress.  Abdominal: Soft. Bowel sounds are normal. He exhibits no distension.  Musculoskeletal: He exhibits no edema.  Lymphadenopathy:    He has no cervical adenopathy.  Neurological: He is alert and oriented to person, place, and time. No cranial nerve deficit.  Skin: Skin is warm and dry.  Psychiatric: He has a normal mood and affect. His behavior is normal.          Assessment & Plan:

## 2012-11-01 LAB — HEPATIC FUNCTION PANEL
Albumin: 4.2 g/dL (ref 3.5–5.2)
Alkaline Phosphatase: 70 U/L (ref 39–117)
Bilirubin, Direct: 0.1 mg/dL (ref 0.0–0.3)
Total Protein: 6.3 g/dL (ref 6.0–8.3)

## 2012-11-01 LAB — CBC WITH DIFFERENTIAL/PLATELET
Basophils Absolute: 0.1 10*3/uL (ref 0.0–0.1)
HCT: 44.1 % (ref 39.0–52.0)
Hemoglobin: 14.3 g/dL (ref 13.0–17.0)
Lymphs Abs: 2.2 10*3/uL (ref 0.7–4.0)
MCHC: 32.5 g/dL (ref 30.0–36.0)
MCV: 85.2 fl (ref 78.0–100.0)
Monocytes Absolute: 0.5 10*3/uL (ref 0.1–1.0)
Monocytes Relative: 6 % (ref 3.0–12.0)
Neutro Abs: 5.5 10*3/uL (ref 1.4–7.7)
Platelets: 228 10*3/uL (ref 150.0–400.0)
RBC: 5.18 Mil/uL (ref 4.22–5.81)
RDW: 14 % (ref 11.5–14.6)
WBC: 8.7 10*3/uL (ref 4.5–10.5)

## 2012-11-01 LAB — BASIC METABOLIC PANEL
CO2: 23 mEq/L (ref 19–32)
Calcium: 9.2 mg/dL (ref 8.4–10.5)
Creatinine, Ser: 0.9 mg/dL (ref 0.4–1.5)
GFR: 97.12 mL/min (ref >60.00)
Sodium: 140 mEq/L (ref 135–145)

## 2012-11-01 LAB — TESTOSTERONE, FREE, TOTAL, SHBG
Sex Hormone Binding: 22 nmol/L (ref 13–71)
Testosterone, Free: 41.9 pg/mL — ABNORMAL LOW (ref 47.0–244.0)
Testosterone: 179 ng/dL — ABNORMAL LOW (ref 300–890)

## 2012-11-01 NOTE — Assessment & Plan Note (Signed)
New.  Check testosterone level.  If low, this could be contributing to pt's fatigue.  Will follow.

## 2012-11-01 NOTE — Assessment & Plan Note (Signed)
Recurrent issue for pt.  sxs started around time pt lost his job.  May be multifactorial- including depression.  Check labs to r/o metabolic abnormality.  Will follow.

## 2012-11-02 ENCOUNTER — Other Ambulatory Visit: Payer: Self-pay | Admitting: Family Medicine

## 2012-11-02 ENCOUNTER — Telehealth: Payer: Self-pay

## 2012-11-02 DIAGNOSIS — R7989 Other specified abnormal findings of blood chemistry: Secondary | ICD-10-CM

## 2012-11-02 DIAGNOSIS — E349 Endocrine disorder, unspecified: Secondary | ICD-10-CM

## 2012-11-02 NOTE — Telephone Encounter (Signed)
Patient called to review recent labs. Advised of labs and made referral to Urology.

## 2012-11-02 NOTE — Progress Notes (Signed)
Referral placed and pt notified

## 2012-11-11 ENCOUNTER — Encounter: Payer: Self-pay | Admitting: Family Medicine

## 2013-04-20 ENCOUNTER — Telehealth: Payer: Self-pay

## 2013-04-20 NOTE — Telephone Encounter (Signed)
Left message for call back. Non-identifiable.  Flu-due Tdap- 03/23/05 PSA- 03/11/16- 0.49

## 2013-04-24 ENCOUNTER — Ambulatory Visit (INDEPENDENT_AMBULATORY_CARE_PROVIDER_SITE_OTHER): Payer: Medicaid Other | Admitting: Family Medicine

## 2013-04-24 ENCOUNTER — Encounter: Payer: Self-pay | Admitting: Family Medicine

## 2013-04-24 VITALS — BP 130/80 | HR 102 | Temp 98.2°F | Resp 16 | Ht 71.0 in | Wt 239.4 lb

## 2013-04-24 DIAGNOSIS — Z6835 Body mass index (BMI) 35.0-35.9, adult: Secondary | ICD-10-CM | POA: Insufficient documentation

## 2013-04-24 DIAGNOSIS — R7989 Other specified abnormal findings of blood chemistry: Secondary | ICD-10-CM | POA: Insufficient documentation

## 2013-04-24 DIAGNOSIS — E291 Testicular hypofunction: Secondary | ICD-10-CM

## 2013-04-24 DIAGNOSIS — Z0000 Encounter for general adult medical examination without abnormal findings: Secondary | ICD-10-CM | POA: Insufficient documentation

## 2013-04-24 DIAGNOSIS — E669 Obesity, unspecified: Secondary | ICD-10-CM

## 2013-04-24 NOTE — Progress Notes (Signed)
   Subjective:    Patient ID: [name], male    DOB: [birth year], 48 y.o.   MRN: [id number]  HPI CPE- no concerns today.   Review of Systems Patient reports no vision/hearing changes, anorexia, fever ,adenopathy, persistant/recurrent hoarseness, swallowing issues, chest pain, palpitations, edema, persistant/recurrent cough, hemoptysis, dyspnea (rest,exertional, paroxysmal nocturnal), gastrointestinal  bleeding (melena, rectal bleeding), abdominal pain, excessive heart burn, GU symptoms (dysuria, hematuria, voiding/incontinence issues) syncope, focal weakness, memory loss, numbness & tingling, skin/hair/nail changes, depression, anxiety, abnormal bruising/bleeding, musculoskeletal symptoms/signs.     Objective:   Physical Exam BP 130/80  Pulse 102  Temp(Src) 98.2 F (36.8 C) (Oral)  Resp 16  Ht 5\' 11"  (1.803 m)  Wt 239 lb 6 oz (108.58 kg)  BMI 33.40 kg/m2  SpO2 96%  General Appearance:    Alert, cooperative, no distress, appears stated age, obese  Head:    Normocephalic, without obvious abnormality, atraumatic  Eyes:    PERRL, conjunctiva/corneas clear, EOM's intact, fundi    benign, both eyes       Ears:    Normal TM's and external ear canals, both ears  Nose:   Nares normal, septum midline, mucosa normal, no drainage   or sinus tenderness  Throat:   Lips, mucosa, and tongue normal; teeth and gums normal  Neck:   Supple, symmetrical, trachea midline, no adenopathy;       thyroid:  No enlargement/tenderness/nodules  Back:     Symmetric, no curvature, ROM normal, no CVA tenderness  Lungs:     Clear to auscultation bilaterally, respirations unlabored  Chest wall:    No tenderness or deformity  Heart:    Regular rate and rhythm, S1 and S2 normal, no murmur, rub   or gallop  Abdomen:     Soft, non-tender, bowel sounds active all four quadrants,    no masses, no organomegaly  Genitalia:    Normal male without lesion, discharge or tenderness  Rectal:    Normal tone, normal  prostate, no masses or tenderness  Extremities:   Extremities normal, atraumatic, no cyanosis or edema  Pulses:   2+ and symmetric all extremities  Skin:   Skin color, texture, turgor normal, no rashes or lesions  Lymph nodes:   Cervical, supraclavicular, and axillary nodes normal  Neurologic:   CNII-XII intact. Normal strength, sensation and reflexes      throughout          Assessment & Plan:

## 2013-04-24 NOTE — Assessment & Plan Note (Signed)
Encouraged regular, aerobic activity for 30 minutes at least 4x/week and monitoring caloric intake w/ the help of My[employer]itnessPal app.  

## 2013-04-24 NOTE — Assessment & Plan Note (Signed)
Pt's PE WNL w/ exception of obesity.  Check labs.  Anticipatory guidance provided.  

## 2013-04-24 NOTE — Assessment & Plan Note (Signed)
Noted on labs at last visit.  Pt never went to urology as recommended.  Repeat labs today.

## 2013-04-24 NOTE — Progress Notes (Signed)
Pre visit review using our clinic review tool, if applicable. No additional management support is needed unless otherwise documented below in the visit note. 

## 2013-04-24 NOTE — Patient Instructions (Signed)
Follow up in 1 year or as needed Try and make healthy food choices and get regular exercise We'll notify you of your lab results and make any changes if needed Call with any questions or concerns Happy [birth year] Birthday!

## 2013-04-25 ENCOUNTER — Other Ambulatory Visit: Payer: Self-pay | Admitting: Family Medicine

## 2013-04-25 DIAGNOSIS — R7989 Other specified abnormal findings of blood chemistry: Secondary | ICD-10-CM

## 2013-04-25 LAB — LIPID PANEL
CHOLESTEROL: 135 mg/dL (ref 0–200)
HDL: 33 mg/dL — ABNORMAL LOW (ref >39.00)
LDL Cholesterol: 84 mg/dL (ref 0–99)
TRIGLYCERIDES: 89 mg/dL (ref 0.0–149.0)
Total CHOL/HDL Ratio: 4
VLDL: 17.8 mg/dL (ref 0.0–40.0)

## 2013-04-25 LAB — HEPATIC FUNCTION PANEL
ALBUMIN: 4.2 g/dL (ref 3.5–5.2)
ALT: 32 U/L (ref 0–53)
AST: 24 U/L (ref 0–37)
Alkaline Phosphatase: 74 U/L (ref 39–117)
Bilirubin, Direct: 0.1 mg/dL (ref 0.0–0.3)
TOTAL PROTEIN: 6.4 g/dL (ref 6.0–8.3)
Total Bilirubin: 0.8 mg/dL (ref 0.3–1.2)

## 2013-04-25 LAB — BASIC METABOLIC PANEL
BUN: 16 mg/dL (ref 6–23)
CALCIUM: 9.3 mg/dL (ref 8.4–10.5)
CO2: 21 meq/L (ref 19–32)
CREATININE: 0.8 mg/dL (ref 0.4–1.5)
Chloride: 110 mEq/L (ref 96–112)
GFR: 106.53 mL/min (ref >60.00)
GLUCOSE: 89 mg/dL (ref 70–99)
Potassium: 4.3 mEq/L (ref 3.5–5.1)
Sodium: 140 mEq/L (ref 135–145)

## 2013-04-25 LAB — CBC WITH DIFFERENTIAL/PLATELET
BASOS PCT: 0.2 % (ref 0.0–3.0)
Basophils Absolute: 0 10*3/uL (ref 0.0–0.1)
EOS PCT: 5.8 % — AB (ref 0.0–5.0)
Eosinophils Absolute: 0.7 10*3/uL (ref 0.0–0.7)
HEMATOCRIT: 45.8 % (ref 39.0–52.0)
HEMOGLOBIN: 14.7 g/dL (ref 13.0–17.0)
LYMPHS ABS: 2.8 10*3/uL (ref 0.7–4.0)
Lymphocytes Relative: 24.3 % (ref 12.0–46.0)
MCHC: 32.2 g/dL (ref 30.0–36.0)
MCV: 85.5 fl (ref 78.0–100.0)
MONOS PCT: 4 % (ref 3.0–12.0)
Monocytes Absolute: 0.5 10*3/uL (ref 0.1–1.0)
NEUTROS ABS: 7.5 10*3/uL (ref 1.4–7.7)
Neutrophils Relative %: 65.7 % (ref 43.0–77.0)
Platelets: 229 10*3/uL (ref 150.0–400.0)
RBC: 5.36 Mil/uL (ref 4.22–5.81)
RDW: 14.9 % — AB (ref 11.5–14.6)
WBC: 11.4 10*3/uL — ABNORMAL HIGH (ref 4.5–10.5)

## 2013-04-25 LAB — TESTOSTERONE, FREE, TOTAL, SHBG
SEX HORMONE BINDING: 20 nmol/L (ref 13–71)
TESTOSTERONE FREE: 33.3 pg/mL — AB (ref 47.0–244.0)
TESTOSTERONE: 138 ng/dL — AB (ref 300–890)
Testosterone-% Free: 2.4 % (ref 1.6–2.9)

## 2013-04-25 LAB — TSH: TSH: 1.91 u[IU]/mL (ref 0.35–5.50)

## 2013-04-25 LAB — PSA: PSA: 0.26 ng/mL (ref 0.10–4.00)

## 2013-04-26 ENCOUNTER — Encounter: Payer: Self-pay | Admitting: General Practice

## 2013-04-26 ENCOUNTER — Other Ambulatory Visit: Payer: Self-pay | Admitting: General Practice

## 2013-04-26 MED ORDER — BUDESONIDE-FORMOTEROL FUMARATE 80-4.5 MCG/ACT IN AERO: 2.0000 | INHALATION_SPRAY | Freq: Two times a day (BID) | RESPIRATORY_TRACT | Status: DC

## 2013-04-28 ENCOUNTER — Other Ambulatory Visit: Payer: Self-pay | Admitting: *Deleted

## 2013-04-28 MED ORDER — BUDESONIDE-FORMOTEROL FUMARATE 80-4.5 MCG/ACT IN AERO: 2.0000 | INHALATION_SPRAY | Freq: Two times a day (BID) | RESPIRATORY_TRACT | Status: DC

## 2013-04-28 NOTE — Telephone Encounter (Signed)
Unable to reach patient Pre-Visit.    

## 2013-07-08 ENCOUNTER — Other Ambulatory Visit: Payer: Self-pay | Admitting: Family Medicine

## 2013-07-10 NOTE — Telephone Encounter (Signed)
Med filled.  

## 2013-10-31 ENCOUNTER — Telehealth: Payer: Self-pay | Admitting: Family Medicine

## 2013-10-31 NOTE — Telephone Encounter (Signed)
Calling on the behalf of his father, [name], Sr.  48 y.o. Male.  Father is now living with him and needs to follow up with a provider.  Went to [employer] today.  Pneumonia--started on antibx.  Needs to follow up with Dr. Birdie Riddle.  New patient appointment not until 01/29/14 at 3 pm.  Follow up appointment scheduled 11/07/13 @ 11:30 am.

## 2013-10-31 NOTE — Telephone Encounter (Signed)
Caryl Pina will you please triage pt

## 2013-10-31 NOTE — Telephone Encounter (Signed)
Caller name: Rushawn Capshaw Relation to pt: Self Call back number: [phone]  Reason for call: Patient would like Dr. Birdie Riddle to call him, pt stated it was "personal" he did not want to elaborate. I advised pt he's message would be forwarded to a nurse he said ok

## 2013-10-31 NOTE — Telephone Encounter (Signed)
Caller name: Chasen Mendell Relation to pt: self  Call back number: [phone]   Reason for call: Pt would like to speak with Dr. Birdie Riddle and expressed it was  "Personal" he did not want to elaborate at all. I advised pt I will send message to nurse.

## 2013-11-08 ENCOUNTER — Encounter: Payer: Self-pay | Admitting: Medical

## 2013-11-08 ENCOUNTER — Ambulatory Visit (INDEPENDENT_AMBULATORY_CARE_PROVIDER_SITE_OTHER): Payer: BC Managed Care – PPO | Admitting: Medical

## 2013-11-08 VITALS — BP 119/77 | HR 90 | Temp 98.2°F | Wt 227.0 lb

## 2013-11-08 DIAGNOSIS — M109 Gout, unspecified: Secondary | ICD-10-CM

## 2013-11-08 DIAGNOSIS — M25572 Pain in left ankle and joints of left foot: Secondary | ICD-10-CM

## 2013-11-08 DIAGNOSIS — M25579 Pain in unspecified ankle and joints of unspecified foot: Secondary | ICD-10-CM | POA: Insufficient documentation

## 2013-11-08 MED ORDER — INDOMETHACIN 50 MG PO CAPS: 50.0000 mg | ORAL_CAPSULE | Freq: Three times a day (TID) | ORAL | Status: DC

## 2013-11-08 NOTE — Progress Notes (Signed)
   Subjective:    Patient ID: [name], male    DOB: 1965-08-07, 48 y.o.   MRN: [id number]  HPI  Pt in states left foot/base of lt great toe got tender over the weekend.  Pt thought shoes hurt area. On feet all day. He has history of gout and had some 4 years ago. No recent alcohol. No obvious red meats. Pt thinks it is gout. Pt had elevated uric acid levels elevated in the past. I don't see uric acid elevation in past. Pt states this has been his location where he has had it before. He states last time evaluated at urgent care.   Review of Systems  Constitutional: Negative for fever, chills and fatigue.  Respiratory: Negative for cough, chest tightness, wheezing and stridor.   Cardiovascular: Negative for chest pain and palpitations.  Gastrointestinal: Negative.   Musculoskeletal:       Lt great toe. Base of pain.Just at the joint.  Skin: Positive for color change. Negative for pallor, rash and wound.       Only medial aspect of foot. Base of great toe left side.  Hematological: Negative for adenopathy. Does not bruise/bleed easily.       Objective:   Physical Exam General- nad, pleasant pt.  Lt foot- great toe looks normal. But at base/at joint moderate-severe tenderness to palpation medial aspect. Skin- mild redness over joint. Not warm or fluctuant. Lymphatic exam- negative. No nodes palpated. No palpable chord. No tacking up foot/leg.       Assessment & Plan:

## 2013-11-08 NOTE — Patient Instructions (Addendum)
Your appear to have likely gout based on location of pain and features. Will get labs today. Will prescribe indocin today. If this pain is resolved but  If symptoms reoccur quickly then would consider allopurinol(preventative)  and xray of the foot. If any redness streaking up foot then let us know.(Remote possible infection could be present but this does look like gout.) Follow up 7 days or as needed.

## 2013-11-08 NOTE — Progress Notes (Signed)
Pre visit review using our clinic review tool, if applicable. No additional management support is needed unless otherwise documented below in the visit note. 

## 2013-11-08 NOTE — Assessment & Plan Note (Signed)
Appears in classic podagra type location. Will get uric acid, cbc, and cmp. I think he may need preventative med. He expresses that since rare symptoms he does not need  Preventative med. I advised him that if he responds quickly but then get suspicous symptoms again then would get another uric acid, foot xray and rx allopurinol. Presently pt declines xray or preventative tx.

## 2013-11-09 LAB — COMPREHENSIVE METABOLIC PANEL
ALBUMIN: 4.1 g/dL (ref 3.5–5.2)
ALK PHOS: 73 U/L (ref 39–117)
ALT: 22 U/L (ref 0–53)
AST: 19 U/L (ref 0–37)
BILIRUBIN TOTAL: 0.9 mg/dL (ref 0.2–1.2)
BUN: 16 mg/dL (ref 6–23)
CO2: 21 mEq/L (ref 19–32)
Calcium: 9 mg/dL (ref 8.4–10.5)
Chloride: 106 mEq/L (ref 96–112)
Creatinine, Ser: 0.9 mg/dL (ref 0.4–1.5)
GFR: 96.7 mL/min (ref >60.00)
GLUCOSE: 88 mg/dL (ref 70–99)
POTASSIUM: 3.8 meq/L (ref 3.5–5.1)
SODIUM: 138 meq/L (ref 135–145)
TOTAL PROTEIN: 6.3 g/dL (ref 6.0–8.3)

## 2013-11-09 LAB — CBC WITH DIFFERENTIAL/PLATELET
BASOS PCT: 0.1 % (ref 0.0–3.0)
Basophils Absolute: 0 10*3/uL (ref 0.0–0.1)
EOS ABS: 0.4 10*3/uL (ref 0.0–0.7)
EOS PCT: 3.9 % (ref 0.0–5.0)
HCT: 43.1 % (ref 39.0–52.0)
Hemoglobin: 14.1 g/dL (ref 13.0–17.0)
LYMPHS PCT: 25.7 % (ref 12.0–46.0)
Lymphs Abs: 2.5 10*3/uL (ref 0.7–4.0)
MCHC: 32.6 g/dL (ref 30.0–36.0)
MCV: 82.9 fl (ref 78.0–100.0)
Monocytes Absolute: 0.5 10*3/uL (ref 0.1–1.0)
Monocytes Relative: 4.8 % (ref 3.0–12.0)
NEUTROS PCT: 65.5 % (ref 43.0–77.0)
Neutro Abs: 6.3 10*3/uL (ref 1.4–7.7)
PLATELETS: 217 10*3/uL (ref 150.0–400.0)
RBC: 5.2 Mil/uL (ref 4.22–5.81)
RDW: 14.7 % (ref 11.5–15.5)
WBC: 9.7 10*3/uL (ref 4.0–10.5)

## 2013-11-09 LAB — URIC ACID: URIC ACID, SERUM: 7.2 mg/dL (ref 4.0–7.8)

## 2014-02-02 ENCOUNTER — Ambulatory Visit (INDEPENDENT_AMBULATORY_CARE_PROVIDER_SITE_OTHER): Payer: BC Managed Care – PPO

## 2014-02-02 VITALS — BP 140/90 | HR 76

## 2014-02-02 DIAGNOSIS — Z0130 Encounter for examination of blood pressure without abnormal findings: Secondary | ICD-10-CM

## 2014-02-02 NOTE — Progress Notes (Signed)
Checked patient's BP, O2,pulse. Patient is in no immediate distress. Patient had questions regarding glucose due to DM family history. States that he was dizzy the other day possibly sinus issues and has had a dull headache that is not relieved with OTC pain killers.  Advised patient that he needs to be seen by his PCP. Reviewed for availability. Advised of appt slots for next week but he states that he cannot get off work. Would like to be put on cancellation list.  Gave precautions for ED concerning acute cardiac symptoms. States understanding.

## 2014-02-12 ENCOUNTER — Emergency Department (HOSPITAL_COMMUNITY)
Admission: EM | Admit: 2014-02-12 | Discharge: 2014-02-12 | Disposition: A | Discharge status: Home / Self Care | Payer: BC Managed Care – PPO | Attending: Emergency Medicine | Admitting: Emergency Medicine

## 2014-02-12 ENCOUNTER — Encounter (HOSPITAL_COMMUNITY): Payer: Self-pay | Admitting: Emergency Medicine

## 2014-02-12 ENCOUNTER — Telehealth: Payer: Self-pay | Admitting: Family Medicine

## 2014-02-12 ENCOUNTER — Emergency Department (HOSPITAL_COMMUNITY): Payer: BC Managed Care – PPO

## 2014-02-12 DIAGNOSIS — M109 Gout, unspecified: Secondary | ICD-10-CM | POA: Diagnosis not present

## 2014-02-12 DIAGNOSIS — Z79899 Other long term (current) drug therapy: Secondary | ICD-10-CM | POA: Insufficient documentation

## 2014-02-12 DIAGNOSIS — J45909 Unspecified asthma, uncomplicated: Secondary | ICD-10-CM | POA: Diagnosis not present

## 2014-02-12 DIAGNOSIS — Z7951 Long term (current) use of inhaled steroids: Secondary | ICD-10-CM | POA: Insufficient documentation

## 2014-02-12 DIAGNOSIS — Z791 Long term (current) use of non-steroidal anti-inflammatories (NSAID): Secondary | ICD-10-CM | POA: Diagnosis not present

## 2014-02-12 DIAGNOSIS — Z9852 Vasectomy status: Secondary | ICD-10-CM | POA: Insufficient documentation

## 2014-02-12 DIAGNOSIS — R079 Chest pain, unspecified: Secondary | ICD-10-CM

## 2014-02-12 LAB — CBC
HEMATOCRIT: 43.8 % (ref 39.0–52.0)
Hemoglobin: 14.4 g/dL (ref 13.0–17.0)
MCH: 27.1 pg (ref 26.0–34.0)
MCHC: 32.9 g/dL (ref 30.0–36.0)
MCV: 82.3 fL (ref 78.0–100.0)
PLATELETS: 206 10*3/uL (ref 150–400)
RBC: 5.32 MIL/uL (ref 4.22–5.81)
RDW: 13.5 % (ref 11.5–15.5)
WBC: 10.5 10*3/uL (ref 4.0–10.5)

## 2014-02-12 LAB — BASIC METABOLIC PANEL
ANION GAP: 13 (ref 5–15)
BUN: 17 mg/dL (ref 6–23)
CALCIUM: 9.1 mg/dL (ref 8.4–10.5)
CO2: 24 mEq/L (ref 19–32)
CREATININE: 0.95 mg/dL (ref 0.50–1.35)
Chloride: 100 mEq/L (ref 96–112)
Glucose, Bld: 89 mg/dL (ref 70–99)
Potassium: 4.2 mEq/L (ref 3.7–5.3)
Sodium: 137 mEq/L (ref 137–147)

## 2014-02-12 LAB — I-STAT TROPONIN, ED: Troponin i, poc: 0 ng/mL (ref 0.00–0.08)

## 2014-02-12 MED ORDER — HYOSCYAMINE SULFATE 0.125 MG SL SUBL
0.1250 mg | SUBLINGUAL_TABLET | SUBLINGUAL | Status: DC | PRN
Start: 2014-02-12 — End: 2014-03-07

## 2014-02-12 NOTE — Discharge Instructions (Signed)

## 2014-02-12 NOTE — ED Provider Notes (Signed)
CSN: [id number]     Arrival date & time 02/12/14  1325 History   First MD Initiated Contact with Patient 02/12/14 1508     Chief Complaint  Patient presents with  . Chest Pain     (Consider location/radiation/quality/duration/timing/severity/associated sxs/prior Treatment) HPI Comments: Pt here with 2 days of episodic left pain lasting seconds--symptoms not associated with diaphoresis, dyspnea, or nausea--no recent fever, chills, cough--no prior h/o same, nothing makes his sx better or worse, no tx used pta--denies injury or leg pain, called his pcp and told to come here  Patient is a 48 y.o. male presenting with chest pain. The history is provided by the patient.  Chest Pain   Past Medical History  Diagnosis Date  . Asthma   . Gout   . H/O vasectomy    Past Surgical History  Procedure Laterality Date  . Anterior cruciate ligament repair    . Tonsilectomy, adenoidectomy, bilateral myringotomy and tubes    . Vasectomy     Family History  Problem Relation Age of Onset  . Hypertension Mother   . Diabetes Mother   . Cancer Mother   . Diabetes Father   . Hypertension Father    History  Substance Use Topics  . Smoking status: Never Smoker   . Smokeless tobacco: Not on file  . Alcohol Use: Yes    Review of Systems  Cardiovascular: Positive for chest pain.  All other systems reviewed and are negative.     Allergies  Review of patient's allergies indicates no known allergies.  Home Medications   Prior to Admission medications   Medication Sig Start Date End Date Taking? Authorizing Provider  budesonide-formoterol (SYMBICORT) 80-4.5 MCG/ACT inhaler Inhale 2 puffs into the lungs 2 (two) times daily. 04/28/13 04/28/14 Yes Midge Minium, MD  loratadine (ALLERGY RELIEF) 10 MG tablet Take 10 mg by mouth daily.   Yes Historical Provider, MD  PROAIR HFA 108 (90 BASE) MCG/ACT inhaler INHALE TWO   PUFFS INTO THE LUNGS EVERY SIX HOURS   AS NEEDED   FOR WHEEZING   Yes  Midge Minium, MD  indomethacin (INDOCIN) 50 MG capsule Take 1 capsule (50 mg total) by mouth 3 (three) times daily with meals. 11/08/13   Meriam Sprague Saguier, PA-C   BP 136/82 mmHg  Pulse 92  Temp(Src) 97.8 F (36.6 C) (Oral)  Resp 16  SpO2 97% Physical Exam  Constitutional: He is oriented to person, place, and time. He appears well-developed and well-nourished.  Non-toxic appearance. No distress.  HENT:  Head: Normocephalic and atraumatic.  Eyes: Conjunctivae, EOM and lids are normal. Pupils are equal, round, and reactive to light.  Neck: Normal range of motion. Neck supple. No tracheal deviation present. No thyroid mass present.  Cardiovascular: Normal rate, regular rhythm and normal heart sounds.  Exam reveals no gallop.   No murmur heard. Pulmonary/Chest: Effort normal and breath sounds normal. No stridor. No respiratory distress. He has no decreased breath sounds. He has no wheezes. He has no rhonchi. He has no rales.  Abdominal: Soft. Normal appearance and bowel sounds are normal. He exhibits no distension. There is no tenderness. There is no rebound and no CVA tenderness.  Musculoskeletal: Normal range of motion. He exhibits no edema or tenderness.  Neurological: He is alert and oriented to person, place, and time. He has normal strength. No cranial nerve deficit or sensory deficit. GCS eye subscore is 4. GCS verbal subscore is 5. GCS motor subscore is 6.  Skin: Skin  is warm and dry. No abrasion and no rash noted.  Psychiatric: He has a normal mood and affect. His speech is normal and behavior is normal.  Nursing note and vitals reviewed.   ED Course  Procedures (including critical care time) Labs Review Labs Reviewed  [place], ED    Imaging Review No results found.   EKG Interpretation   Date/Time:  Monday February 12 2014 13:31:32 EST Ventricular Rate:  86 PR Interval:  158 QRS Duration: 80 QT Interval:  364 QTC  Calculation: 435 R Axis:   78 Text Interpretation:  Sinus rhythm Left atrial enlargement ST elev,  probable normal early repol pattern Baseline wander in lead(s) I Confirmed  by Maceo Hernan  MD, Darell Saputo ([id number]) on 02/12/2014 3:09:17 PM      MDM   Final diagnoses:  Chest pain   Patient with atypical symptoms for angina. Symptoms only last for a few seconds. Do not think this represents ACS or pulmonary embolism. Stable for discharge     Leota Jacobsen, MD 02/12/14 [id number]

## 2014-02-12 NOTE — ED Notes (Signed)
Pt reports L lateral chest pain for the past 2 days. Denies any sob, weakness or nausea. Tried antacids yesterday, but could not tell any difference. Had stomach aches 3 days ago. No n/v/d.

## 2014-02-12 NOTE — Telephone Encounter (Signed)
Patient Information:  Caller Name: [name]  Phone: [phone]  Patient: [name], [name]  Gender: Male  DOB: 02-Jan-1966  Age: 48 Years  PCP: Midge Minium.  Office Follow Up:  Does the office need to follow up with this patient?: No  Instructions For The Office: N/A  RN Note:  Pt will hang up and call 911 now; pt is at work; OFFICE PLEASE NOTE AS A HEADS UP THAT PT WAS SENT TO ED VIA 911 DUE TO CHEST PAIN  Symptoms  Reason For Call & Symptoms: Pt is calling and states  that for the last couple of days he is having pain to left chest area; feels like he has gas; took antacid and helped the gas but chest pain is still present; was seen in the office last week to pick up Rx and had BP checked and it was high and he has been having headaches which is new for him; office was to call him for an appt and no one every called him;  chest pain comes and goes; pain is present now; will only go away for 5-10 minutes and then returns; denies SOB; rates pain 6/10  Reviewed Health History In EMR: Yes  Reviewed Medications In EMR: Yes  Reviewed Allergies In EMR: Yes  Reviewed Surgeries / Procedures: Yes  Date of Onset of Symptoms: 02/10/2014  Guideline(s) Used:  Chest Pain  Disposition Per Guideline:   Call EMS 911 Now  Reason For Disposition Reached:   Chest pain lasting longer than 5 minutes and ANY of the following:  Over 37 years old Over 75 years old and at least one cardiac risk factor (i.e., high blood pressure, diabetes, high cholesterol, obesity, smoker or strong family history of heart disease) Pain is crushing, pressure-like, or heavy  Took nitroglycerin and chest pain was not relieved History of heart disease (i.e., angina, heart attack, bypass surgery, angioplasty, CHF)  Advice Given:  Call Back If:  You become worse.  Patient Will Follow Care Advice:  YES

## 2014-02-20 ENCOUNTER — Ambulatory Visit (INDEPENDENT_AMBULATORY_CARE_PROVIDER_SITE_OTHER): Payer: BC Managed Care – PPO | Admitting: Family Medicine

## 2014-02-20 ENCOUNTER — Encounter: Payer: Self-pay | Admitting: Family Medicine

## 2014-02-20 VITALS — BP 128/82 | HR 74 | Temp 98.1°F | Resp 16 | Wt 236.0 lb

## 2014-02-20 DIAGNOSIS — K219 Gastro-esophageal reflux disease without esophagitis: Secondary | ICD-10-CM

## 2014-02-20 LAB — H. PYLORI ANTIBODY, IGG: H PYLORI IGG: NEGATIVE

## 2014-02-20 MED ORDER — OMEPRAZOLE 40 MG PO CPDR: 40.0000 mg | DELAYED_RELEASE_CAPSULE | Freq: Every day | ORAL | Status: DC

## 2014-02-20 MED ORDER — GI COCKTAIL ~~LOC~~
30.0000 mL | Freq: Once | ORAL | Status: AC
  Administered 2014-02-20: 30 mL via ORAL

## 2014-02-20 NOTE — Patient Instructions (Signed)
Follow up in 2-3 weeks if no improvement Start the Omeprazole daily to decrease the acid production, gas, and gurgling We'll notify you of your H pylori lab results If no improvement or worsening, please call so we can send you to GI Call with any questions or concerns Hang in there!!

## 2014-02-20 NOTE — Assessment & Plan Note (Signed)
New.  Suspect this is cause of CP.  Pt had good relief of CP w/ GI cocktail in office.  Check labs to r/o H pylori.  Start PPI daily.  Reviewed dietary and lifestyle modifications.  Will follow.

## 2014-02-20 NOTE — Progress Notes (Signed)
Pre visit review using our clinic review tool, if applicable. No additional management support is needed unless otherwise documented below in the visit note. 

## 2014-02-20 NOTE — Progress Notes (Signed)
   Subjective:    Patient ID: George Needle., male    DOB: [birth year], 48 y.o.   MRN: [id number]  HPI ER f/u- pt was seen in ER on 11/23 for CP.  Had normal troponins and EKG. Was started on Levsin w/ some relief.  Pt reports he will have intermittent pain under L arm and then it will radiate towards the center, 'like you're poking'.  Feels pain today is due to fact he hasn't eaten since 5am.  No N/V.  Pt reports increased GERD.  Denies increased stress.  + fatigue.  'i just don't feel good'.  Pt reports increased gas 'for a couple months'.   Review of Systems For ROS see HPI     Objective:   Physical Exam  Constitutional: He is oriented to person, place, and time. He appears well-developed and well-nourished. No distress.  HENT:  Head: Normocephalic and atraumatic.  Eyes: Conjunctivae and EOM are normal. Pupils are equal, round, and reactive to light.  Neck: Normal range of motion. Neck supple. No thyromegaly present.  Cardiovascular: Normal rate, regular rhythm, normal heart sounds and intact distal pulses.   No murmur heard. Pulmonary/Chest: Effort normal and breath sounds normal. No respiratory distress.  Abdominal: Soft. He exhibits no distension. There is no tenderness. There is no rebound and no guarding.  Hyperactive BS  Musculoskeletal: He exhibits no edema.  Lymphadenopathy:    He has no cervical adenopathy.  Neurological: He is alert and oriented to person, place, and time. No cranial nerve deficit.  Skin: Skin is warm and dry.  Psychiatric: He has a normal mood and affect. His behavior is normal.  Vitals reviewed.         Assessment & Plan:

## 2014-03-07 ENCOUNTER — Ambulatory Visit (INDEPENDENT_AMBULATORY_CARE_PROVIDER_SITE_OTHER): Payer: BC Managed Care – PPO | Admitting: Family Medicine

## 2014-03-07 ENCOUNTER — Encounter: Payer: Self-pay | Admitting: Family Medicine

## 2014-03-07 VITALS — BP 130/72 | HR 82 | Temp 98.2°F | Resp 16 | Wt 237.0 lb

## 2014-03-07 DIAGNOSIS — K219 Gastro-esophageal reflux disease without esophagitis: Secondary | ICD-10-CM

## 2014-03-07 MED ORDER — BUDESONIDE-FORMOTEROL FUMARATE 80-4.5 MCG/ACT IN AERO: 2.0000 | INHALATION_SPRAY | Freq: Two times a day (BID) | RESPIRATORY_TRACT | Status: DC

## 2014-03-07 NOTE — Progress Notes (Signed)
   Subjective:    Patient ID: George Needle., male    DOB: Oct 14, 1965, 48 y.o.   MRN: [id number]  HPI GERD- pt reports sxs resolved within a few days of taking Prilosec.  Pt was able to stop PPI w/o return of sxs.  No nausea, abd pain.  H pylori was negative.   Review of Systems For ROS see HPI     Objective:   Physical Exam  Constitutional: He is oriented to person, place, and time. He appears well-developed and well-nourished. No distress.  HENT:  Head: Normocephalic and atraumatic.  Abdominal: Soft. Bowel sounds are normal. He exhibits no distension. There is no tenderness. There is no rebound and no guarding.  Neurological: He is alert and oriented to person, place, and time.  Psychiatric: He has a normal mood and affect. His behavior is normal. Thought content normal.  Vitals reviewed.         Assessment & Plan:

## 2014-03-07 NOTE — Progress Notes (Signed)
Pre visit review using our clinic review tool, if applicable. No additional management support is needed unless otherwise documented below in the visit note. 

## 2014-03-07 NOTE — Patient Instructions (Signed)
Schedule your complete physical for after Feb 2nd Keep up the good work you look great! Call with any questions or concerns Happy Holidays!!!

## 2014-03-11 NOTE — Assessment & Plan Note (Signed)
Much improved after a few days on PPI.  Was able to wean off meds successfully.  Currently asymptomatic.  Reviewed dietary and lifestyle modifications and also ability to restart PPI prn.  Pt expressed understanding and is in agreement w/ plan.

## 2014-06-07 ENCOUNTER — Telehealth: Payer: Self-pay | Admitting: Family Medicine

## 2014-06-07 NOTE — Telephone Encounter (Signed)
Pre visit letter sent  °

## 2014-06-27 ENCOUNTER — Ambulatory Visit (INDEPENDENT_AMBULATORY_CARE_PROVIDER_SITE_OTHER): Payer: BLUE CROSS/BLUE SHIELD | Admitting: Family Medicine

## 2014-06-27 ENCOUNTER — Encounter: Payer: Self-pay | Admitting: Family Medicine

## 2014-06-27 VITALS — BP 130/80 | HR 79 | Temp 98.2°F | Resp 16 | Ht 70.0 in | Wt 234.0 lb

## 2014-06-27 DIAGNOSIS — Z0000 Encounter for general adult medical examination without abnormal findings: Secondary | ICD-10-CM | POA: Diagnosis not present

## 2014-06-27 DIAGNOSIS — D225 Melanocytic nevi of trunk: Secondary | ICD-10-CM | POA: Insufficient documentation

## 2014-06-27 DIAGNOSIS — D235 Other benign neoplasm of skin of trunk: Secondary | ICD-10-CM

## 2014-06-27 HISTORY — DX: Melanocytic nevi of trunk: D22.5

## 2014-06-27 NOTE — Progress Notes (Signed)
Pre visit review using our clinic review tool, if applicable. No additional management support is needed unless otherwise documented below in the visit note. 

## 2014-06-27 NOTE — Progress Notes (Signed)
   Subjective:    Patient ID: George Needle., male    DOB: [birth year], 49 y.o.   MRN: [id number]  HPI CPE- no concerns today.   Review of Systems Patient reports no vision/hearing changes, anorexia, fever ,adenopathy, persistant/recurrent hoarseness, swallowing issues, chest pain, palpitations, edema, persistant/recurrent cough, hemoptysis, dyspnea (rest,exertional, paroxysmal nocturnal), gastrointestinal  bleeding (melena, rectal bleeding), abdominal pain, excessive heart burn, GU symptoms (dysuria, hematuria, voiding/incontinence issues) syncope, focal weakness, memory loss, numbness & tingling, skin/hair/nail changes, depression, anxiety, abnormal bruising/bleeding, musculoskeletal symptoms/signs.     Objective:   Physical Exam BP 130/80 mmHg  Pulse 79  Temp(Src) 98.2 F (36.8 C) (Oral)  Resp 16  Ht 5\' 10"  (1.778 m)  Wt 234 lb (106.142 kg)  BMI 33.58 kg/m2  SpO2 97%  General Appearance:    Alert, cooperative, no distress, appears stated age  Head:    Normocephalic, without obvious abnormality, atraumatic  Eyes:    PERRL, conjunctiva/corneas clear, EOM's intact, fundi    benign, both eyes       Ears:    Normal TM's and external ear canals, both ears  Nose:   Nares normal, septum midline, mucosa normal, no drainage   or sinus tenderness  Throat:   Lips, mucosa, and tongue normal; teeth and gums normal  Neck:   Supple, symmetrical, trachea midline, no adenopathy;       thyroid:  No enlargement/tenderness/nodules  Back:     Symmetric, no curvature, ROM normal, no CVA tenderness  Lungs:     Clear to auscultation bilaterally, respirations unlabored  Chest wall:    No tenderness or deformity  Heart:    Regular rate and rhythm, S1 and S2 normal, no murmur, rub   or gallop  Abdomen:     Soft, non-tender, bowel sounds active all four quadrants,    no masses, no organomegaly  Genitalia:    Normal male without lesion, discharge or tenderness  Rectal:    Normal tone, normal prostate,  no masses or tenderness  Extremities:   Extremities normal, atraumatic, no cyanosis or edema  Pulses:   2+ and symmetric all extremities  Skin:   Skin color, texture, turgor normal, no rashes or lesions  Lymph nodes:   Cervical, supraclavicular, and axillary nodes normal  Neurologic:   CNII-XII intact. Normal strength, sensation and reflexes      throughout          Assessment & Plan:

## 2014-06-27 NOTE — Patient Instructions (Signed)
Follow up in 1 year or as needed We'll notify you of your lab results and make any changes if needed Try and make healthy food choices and get regular exercise We'll call you with your derm appt Call with any questions or concerns Happy Spring!!

## 2014-06-28 LAB — CBC WITH DIFFERENTIAL/PLATELET
BASOS PCT: 0.8 % (ref 0.0–3.0)
Basophils Absolute: 0.1 10*3/uL (ref 0.0–0.1)
EOS ABS: 0.3 10*3/uL (ref 0.0–0.7)
EOS PCT: 3.1 % (ref 0.0–5.0)
HCT: 42.4 % (ref 39.0–52.0)
Hemoglobin: 14.2 g/dL (ref 13.0–17.0)
LYMPHS ABS: 2.6 10*3/uL (ref 0.7–4.0)
Lymphocytes Relative: 27.7 % (ref 12.0–46.0)
MCHC: 33.5 g/dL (ref 30.0–36.0)
MCV: 79.6 fl (ref 78.0–100.0)
MONO ABS: 0.4 10*3/uL (ref 0.1–1.0)
Monocytes Relative: 4.1 % (ref 3.0–12.0)
Neutro Abs: 6 10*3/uL (ref 1.4–7.7)
Neutrophils Relative %: 64.3 % (ref 43.0–77.0)
PLATELETS: 234 10*3/uL (ref 150.0–400.0)
RBC: 5.32 Mil/uL (ref 4.22–5.81)
RDW: 13.6 % (ref 11.5–15.5)
WBC: 9.3 10*3/uL (ref 4.0–10.5)

## 2014-06-28 LAB — BASIC METABOLIC PANEL
BUN: 13 mg/dL (ref 6–23)
CALCIUM: 9.1 mg/dL (ref 8.4–10.5)
CHLORIDE: 109 meq/L (ref 96–112)
CO2: 23 meq/L (ref 19–32)
CREATININE: 0.84 mg/dL (ref 0.40–1.50)
GFR: 103.1 mL/min (ref >60.00)
Glucose, Bld: 77 mg/dL (ref 70–99)
Potassium: 3.9 mEq/L (ref 3.5–5.1)
Sodium: 139 mEq/L (ref 135–145)

## 2014-06-28 LAB — LIPID PANEL
CHOLESTEROL: 138 mg/dL (ref 0–200)
HDL: 26.9 mg/dL — AB (ref >39.00)
LDL Cholesterol: 90 mg/dL (ref 0–99)
NonHDL: 111.1
TRIGLYCERIDES: 106 mg/dL (ref 0.0–149.0)
Total CHOL/HDL Ratio: 5
VLDL: 21.2 mg/dL (ref 0.0–40.0)

## 2014-06-28 LAB — HEPATIC FUNCTION PANEL
ALBUMIN: 4.2 g/dL (ref 3.5–5.2)
ALK PHOS: 82 U/L (ref 39–117)
ALT: 20 U/L (ref 0–53)
AST: 15 U/L (ref 0–37)
Bilirubin, Direct: 0.2 mg/dL (ref 0.0–0.3)
TOTAL PROTEIN: 6.3 g/dL (ref 6.0–8.3)
Total Bilirubin: 0.8 mg/dL (ref 0.2–1.2)

## 2014-06-28 LAB — PSA: PSA: 0.44 ng/mL (ref 0.10–4.00)

## 2014-06-28 LAB — TSH: TSH: 1.44 u[IU]/mL (ref 0.35–4.50)

## 2014-07-01 NOTE — Assessment & Plan Note (Signed)
Pt's PE WNL w/ exception of obesity.  Check labs.  Anticipatory guidance provided.

## 2014-07-01 NOTE — Assessment & Plan Note (Signed)
New.  Refer to derm.

## 2014-12-14 ENCOUNTER — Encounter: Payer: Self-pay | Admitting: Family Medicine

## 2014-12-14 ENCOUNTER — Ambulatory Visit (INDEPENDENT_AMBULATORY_CARE_PROVIDER_SITE_OTHER): Payer: BLUE CROSS/BLUE SHIELD | Admitting: Family Medicine

## 2014-12-14 VITALS — BP 134/84 | HR 75 | Temp 98.2°F | Resp 16 | Wt 236.4 lb

## 2014-12-14 DIAGNOSIS — N50819 Testicular pain, unspecified: Secondary | ICD-10-CM

## 2014-12-14 DIAGNOSIS — N508 Other specified disorders of male genital organs: Secondary | ICD-10-CM

## 2014-12-14 DIAGNOSIS — E291 Testicular hypofunction: Secondary | ICD-10-CM | POA: Diagnosis not present

## 2014-12-14 DIAGNOSIS — R7989 Other specified abnormal findings of blood chemistry: Secondary | ICD-10-CM

## 2014-12-14 DIAGNOSIS — R300 Dysuria: Secondary | ICD-10-CM | POA: Diagnosis not present

## 2014-12-14 LAB — POCT URINALYSIS DIPSTICK
BILIRUBIN UA: NEGATIVE
GLUCOSE UA: NEGATIVE
KETONES UA: NEGATIVE
Leukocytes, UA: NEGATIVE
Nitrite, UA: NEGATIVE
Protein, UA: NEGATIVE
RBC UA: NEGATIVE
SPEC GRAV UA: 1.025
UROBILINOGEN UA: 0.2
pH, UA: 6

## 2014-12-14 MED ORDER — CIPROFLOXACIN HCL 500 MG PO TABS: 500.0000 mg | ORAL_TABLET | Freq: Two times a day (BID) | ORAL | Status: DC

## 2014-12-14 NOTE — Patient Instructions (Signed)
Follow up as needed Given the pain and frequency, we are going to treat this as an infection Drink plenty of fluids- WATER! Start the Cipro twice daily- take w/ food Call with any questions or concerns Hang in there!!!

## 2014-12-14 NOTE — Progress Notes (Signed)
   Subjective:    Patient ID: [name]., male    DOB: 11-Jun-1965, 49 y.o.   MRN: [id number]  HPI Urinary frequency- sxs started 3-4 days ago.  Today, reports that he will urinate and '3-5 minutes later it feels like I need to go again'.  Now having pain after urination.  Had testicular tenderness 2-3 days ago.  No scrotal swelling.  Not painful to sit.  No testicular pain today.  + family hx of diabetes (brother, mother, aunts/uncles)  No blood in urine.  Increased soda intake recently.  Urine has been dark and malodorous.     Review of Systems For ROS see HPI     Objective:   Physical Exam  Constitutional: He is oriented to person, place, and time. He appears well-developed and well-nourished. No distress.  HENT:  Head: Normocephalic and atraumatic.  Abdominal: Soft. Bowel sounds are normal. He exhibits no distension. There is no tenderness. There is no rebound and no guarding.  Genitourinary:  Pt declined testicular and rectal exams  Neurological: He is alert and oriented to person, place, and time.  Skin: Skin is warm and dry.  Psychiatric: He has a normal mood and affect. His behavior is normal. Thought content normal.  Vitals reviewed.         Assessment & Plan:

## 2014-12-14 NOTE — Progress Notes (Signed)
Pre visit review using our clinic review tool, if applicable. No additional management support is needed unless otherwise documented below in the visit note. 

## 2014-12-16 LAB — URINE CULTURE
COLONY COUNT: NO GROWTH
Organism ID, Bacteria: NO GROWTH

## 2014-12-16 NOTE — Assessment & Plan Note (Signed)
New.  Pt's UA today in office is unremarkable for infection.  Will send for culture.  Discussed need for increased fluid intake.  Based on pt's description, concern for epididymitis or prostatitis.  Will start 7 day course of Cipro and monitor for improvement.  Reviewed supportive care and red flags that should prompt return.  Pt expressed understanding and is in agreement w/ plan.

## 2015-01-25 ENCOUNTER — Ambulatory Visit (INDEPENDENT_AMBULATORY_CARE_PROVIDER_SITE_OTHER): Payer: BLUE CROSS/BLUE SHIELD | Admitting: Medical

## 2015-01-25 ENCOUNTER — Encounter: Payer: Self-pay | Admitting: Medical

## 2015-01-25 ENCOUNTER — Telehealth: Payer: Self-pay

## 2015-01-25 VITALS — BP 140/85 | HR 77 | Temp 97.8°F | Ht 70.0 in | Wt 233.0 lb

## 2015-01-25 DIAGNOSIS — I10 Essential (primary) hypertension: Secondary | ICD-10-CM | POA: Diagnosis not present

## 2015-01-25 LAB — COMPREHENSIVE METABOLIC PANEL
ALBUMIN: 4.2 g/dL (ref 3.5–5.2)
ALT: 22 U/L (ref 0–53)
AST: 14 U/L (ref 0–37)
Alkaline Phosphatase: 66 U/L (ref 39–117)
BUN: 14 mg/dL (ref 6–23)
CALCIUM: 9.6 mg/dL (ref 8.4–10.5)
CHLORIDE: 107 meq/L (ref 96–112)
CO2: 29 mEq/L (ref 19–32)
Creatinine, Ser: 0.81 mg/dL (ref 0.40–1.50)
GFR: 107.27 mL/min (ref >60.00)
Glucose, Bld: 90 mg/dL (ref 70–99)
POTASSIUM: 4.2 meq/L (ref 3.5–5.1)
Sodium: 141 mEq/L (ref 135–145)
TOTAL PROTEIN: 6.1 g/dL (ref 6.0–8.3)
Total Bilirubin: 0.7 mg/dL (ref 0.2–1.2)

## 2015-01-25 MED ORDER — LOSARTAN POTASSIUM 50 MG PO TABS: 50.0000 mg | ORAL_TABLET | Freq: Every day | ORAL | Status: DC

## 2015-01-25 NOTE — Progress Notes (Signed)
Pre visit review using our clinic review tool, if applicable. No additional management support is needed unless otherwise documented below in the visit note. 

## 2015-01-25 NOTE — Patient Instructions (Addendum)
Your bp has been consistently elevated over past 2 wks. I will prescribe  Losartan 50 mg 1 tab a day. Will get cmp today.   Follow up in 2 weeks or as needed.

## 2015-01-25 NOTE — Progress Notes (Signed)
Subjective:    Patient ID: George Needle., male    DOB: 03/18/1966, 49 y.o.   MRN: [id number]  HPI   Pt in for blood pressure check. Pt ealier today bp was 150/108. At urgent care last week was 146/110. At home getting 140/90 range.Pt gets some reading 135/80.  Pt rt know has very faint low level ha.  Pt does not take any bp medication.  Pt states he lost a lot of weight in 2009 he lost weight and his blood pressure did decrease for a while. Last 2 wks increase when he has checked.Describes at least about 10 reading with bp over 140/90.   Review of Systems  Constitutional: Positive for fatigue. Negative for fever, chills, diaphoresis and activity change.  Respiratory: Negative for cough, chest tightness and shortness of breath.   Cardiovascular: Negative for chest pain, palpitations and leg swelling.  Gastrointestinal: Negative for nausea, vomiting and abdominal pain.  Musculoskeletal: Negative for neck pain and neck stiffness.  Neurological: Positive for headaches. Negative for dizziness, tremors, seizures, syncope, facial asymmetry, speech difficulty, weakness, light-headedness and numbness.       Faint ha.  Hematological: Negative for adenopathy. Does not bruise/bleed easily.  Psychiatric/Behavioral: Negative for behavioral problems, confusion and agitation. The patient is not nervous/anxious.     Past Medical History  Diagnosis Date  . Asthma   . Gout   . H/O vasectomy     Social History   Social History  . Marital Status: Married    Spouse Name: N/A  . Number of Children: N/A  . Years of Education: N/A   Occupational History  . Not on file.   Social History Main Topics  . Smoking status: Never Smoker   . Smokeless tobacco: Not on file  . Alcohol Use: Yes  . Drug Use: No  . Sexual Activity: Not on file   Other Topics Concern  . Not on file   Social History Narrative    Past Surgical History  Procedure Laterality Date  . Anterior cruciate ligament  repair    . Tonsilectomy, adenoidectomy, bilateral myringotomy and tubes    . Vasectomy      Family History  Problem Relation Age of Onset  . Hypertension Mother   . Diabetes Mother   . Cancer Mother   . Diabetes Father   . Hypertension Father     No Known Allergies  Current Outpatient Prescriptions on File Prior to Visit  Medication Sig Dispense Refill  . budesonide-formoterol (SYMBICORT) 80-4.5 MCG/ACT inhaler Inhale 2 puffs into the lungs 2 (two) times daily. 1 Inhaler 2  . loratadine (ALLERGY RELIEF) 10 MG tablet Take 10 mg by mouth daily.    Marland Kitchen omeprazole (PRILOSEC) 40 MG capsule Take 1 capsule (40 mg total) by mouth daily. 30 capsule 3  . PROAIR HFA 108 (90 BASE) MCG/ACT inhaler INHALE TWO   PUFFS INTO THE LUNGS EVERY SIX HOURS   AS NEEDED   FOR WHEEZING 8.5 g 3  . testosterone (ANDROGEL) 50 MG/5GM (1%) GEL Place 5 g onto the skin daily.     No current facility-administered medications on file prior to visit.    BP 120/80 mmHg  Pulse 77  Temp(Src) 97.8 F (36.6 C) (Oral)  Ht 5\' 10"  (1.778 m)  Wt 233 lb (105.688 kg)  BMI 33.43 kg/m2  SpO2 98%       Objective:   Physical Exam  General Mental Status- Alert. General Appearance- Not in acute distress.   Skin  General: Color- Normal Color. Moisture- Normal Moisture.  Neck Carotid Arteries- Normal color. Moisture- Normal Moisture. No carotid bruits. No JVD.  Chest and Lung Exam Auscultation: Breath Sounds:-Normal.  Cardiovascular Auscultation:Rythm- Regular. Murmurs & Other Heart Sounds:Auscultation of the heart reveals- No Murmurs.  Abdomen Inspection:-Inspeection Normal. Palpation/Percussion:Note:No mass. Palpation and Percussion of the abdomen reveal- Non Tender, Non Distended + BS, no rebound or guarding.    Neurologic Cranial Nerve exam:- CN III-XII intact(No nystagmus), symmetric smile. Drift Test:- No drift. Romberg Exam:- Negative.  Heal to Toe Gait exam:-Normal. Finger to Nose:-  Normal/Intact Strength:- 5/5 equal and symmetric strength both upper and lower extremities.      Assessment & Plan:  Your bp has been consistently elevated over past 2 wks. I will prescribe  Losartan 50 mg 1 tab a day. Will get cmp today.   Follow up in 2 weeks or as needed.

## 2015-01-25 NOTE — Telephone Encounter (Signed)
Called patient to give advice from Dr. Birdie Riddle. Patient states he is already in waiting room.

## 2015-01-25 NOTE — Telephone Encounter (Signed)
Pt should be evaluated for elevated blood pressure.  I have no openings today but I recommend that he see another provider in this office today or at Saturday clinic tomorrow.  If he has blurry vision, he needs to be seen sooner rather than later and I would recommend not waiting until next week.

## 2015-01-25 NOTE — Telephone Encounter (Signed)
Patient states he has not been feeling well lately. Advised by family to check BP. Went to UC last week BP was 145/100. Feeling fatigued with blurred vision today. Denies pain,problems urinating or bm,s. Checking BP today using wrist cuff BP was 153/104. Wants to see Dr. Birdie Riddle only. Advised that may not be possible but will forward information to her and give callback with response.

## 2015-02-08 ENCOUNTER — Ambulatory Visit (INDEPENDENT_AMBULATORY_CARE_PROVIDER_SITE_OTHER): Payer: BLUE CROSS/BLUE SHIELD | Admitting: Family Medicine

## 2015-02-08 ENCOUNTER — Encounter: Payer: Self-pay | Admitting: Family Medicine

## 2015-02-08 ENCOUNTER — Encounter: Payer: Self-pay | Admitting: General Practice

## 2015-02-08 VITALS — BP 134/80 | HR 58 | Temp 98.0°F | Resp 16 | Ht 70.0 in | Wt 230.2 lb

## 2015-02-08 DIAGNOSIS — G4700 Insomnia, unspecified: Secondary | ICD-10-CM | POA: Diagnosis not present

## 2015-02-08 DIAGNOSIS — E1159 Type 2 diabetes mellitus with other circulatory complications: Secondary | ICD-10-CM | POA: Insufficient documentation

## 2015-02-08 DIAGNOSIS — J4530 Mild persistent asthma, uncomplicated: Secondary | ICD-10-CM | POA: Diagnosis not present

## 2015-02-08 DIAGNOSIS — I10 Essential (primary) hypertension: Secondary | ICD-10-CM | POA: Diagnosis not present

## 2015-02-08 HISTORY — DX: Insomnia, unspecified: G47.00

## 2015-02-08 LAB — BASIC METABOLIC PANEL
BUN: 13 mg/dL (ref 6–23)
CO2: 30 meq/L (ref 19–32)
CREATININE: 0.91 mg/dL (ref 0.40–1.50)
Calcium: 9.5 mg/dL (ref 8.4–10.5)
Chloride: 104 mEq/L (ref 96–112)
GFR: 93.77 mL/min (ref >60.00)
Glucose, Bld: 90 mg/dL (ref 70–99)
Potassium: 4.3 mEq/L (ref 3.5–5.1)
SODIUM: 138 meq/L (ref 135–145)

## 2015-02-08 MED ORDER — ALBUTEROL SULFATE HFA 108 (90 BASE) MCG/ACT IN AERS: INHALATION_SPRAY | RESPIRATORY_TRACT | Status: DC

## 2015-02-08 NOTE — Patient Instructions (Signed)
Follow up in January to recheck blood pressure We'll notify you of your lab results and make any changes if needed Continue to work on healthy diet and regular exercise Use the Symbicort- 2 puffs twice daily- every day to control your asthma Use the Albuterol as needed for shortness of breath/wheezing Take Benadryl to help with sleep when you get home.  If no improvement in sleep, let me know! Continue the Losartan daily until January when I see you Call with any questions or concerns If you want to join [place] at the new [place] office, any scheduled appointments will automatically transfer and we will see you at [address] [place] [address] [place], [place] [address] (OPENING 03/26/15) Happy Holidays!!!

## 2015-02-08 NOTE — Progress Notes (Signed)
   Subjective:    Patient ID: [name]., male    DOB: 05/22/1965, 49 y.o.   MRN: [id number]  HPI HTN- pt was started on Losartan at last visit.  BP is better today.  Yesterday BP was 122/86 at Ortho office.  No CP, SOB, HAs, visual changes, edema.  Insomnia- pt is now working 3rd shift and having sleep issues.  Has only slept 15 hrs this week.  Pt has asked boss to change shift  From 11-7 to 7-3.  He is waiting to see if this will be approved.  Took wife's Ambien and 'i felt worse than without it'.  Pt 'felt like i got hit by a truck' when he took Melatonin.  Asthma- pt reports increased use of Symbicort (was not taking regularly).  Previously not requiring daily use but recently has needed this multiple times/day.  Taking allergy medication.   Review of Systems For ROS see HPI     Objective:   Physical Exam  Constitutional: He is oriented to person, place, and time. He appears well-developed and well-nourished. No distress.  HENT:  Head: Normocephalic and atraumatic.  Eyes: Conjunctivae and EOM are normal. Pupils are equal, round, and reactive to light.  Neck: Normal range of motion. Neck supple. No thyromegaly present.  Cardiovascular: Normal rate, regular rhythm, normal heart sounds and intact distal pulses.   No murmur heard. Pulmonary/Chest: Effort normal and breath sounds normal. No respiratory distress.  Abdominal: Soft. Bowel sounds are normal. He exhibits no distension.  Musculoskeletal: He exhibits no edema.  Lymphadenopathy:    He has no cervical adenopathy.  Neurological: He is alert and oriented to person, place, and time. No cranial nerve deficit.  Skin: Skin is warm and dry.  Psychiatric: He has a normal mood and affect. His behavior is normal.  Vitals reviewed.         Assessment & Plan:

## 2015-02-08 NOTE — Progress Notes (Signed)
Pre visit review using our clinic review tool, if applicable. No additional management support is needed unless otherwise documented below in the visit note. 

## 2015-02-10 NOTE — Assessment & Plan Note (Signed)
Deteriorated.  Stressed need for pt to use Symbicort regularly as this is his controller medication and daily use is expected.  Pt to use Albuterol prn- he has not been using.  Reviewed possible triggers- dust, mold, chemicals- and encouraged avoidance if possible.  Pt expressed understanding and is in agreement w/ plan.

## 2015-02-10 NOTE — Assessment & Plan Note (Signed)
New.  Pt reports he did not tolerate Ambien or Melatonin due to side effects.  Offered him trazodone but he prefers to try OTC Zquil or something similar 1st.  If no improvement, pt to let me know so we can send script for Trazodone.  Pt expressed understanding and is in agreement w/ plan.

## 2015-02-10 NOTE — Assessment & Plan Note (Signed)
Chronic problem.  Better control on Losartan.  Pt doesn't want to take BP meds- feels his BP is elevated due to lack of sleep and stress.  Pt agreeable to continue his BP meds until his schedule changes or we are able to find him a sleep aid that works for him.  Will follow closely.

## 2015-02-13 ENCOUNTER — Telehealth: Payer: Self-pay | Admitting: Family Medicine

## 2015-02-13 ENCOUNTER — Encounter: Payer: Self-pay | Admitting: Family Medicine

## 2015-02-13 ENCOUNTER — Ambulatory Visit (INDEPENDENT_AMBULATORY_CARE_PROVIDER_SITE_OTHER): Payer: BLUE CROSS/BLUE SHIELD | Admitting: Family Medicine

## 2015-02-13 VITALS — BP 124/78 | HR 73 | Temp 98.0°F | Resp 16 | Wt 226.0 lb

## 2015-02-13 DIAGNOSIS — I10 Essential (primary) hypertension: Secondary | ICD-10-CM | POA: Diagnosis not present

## 2015-02-13 MED ORDER — LOSARTAN POTASSIUM 25 MG PO TABS: 25.0000 mg | ORAL_TABLET | Freq: Every day | ORAL | Status: DC

## 2015-02-13 NOTE — Telephone Encounter (Signed)
error:[id number] ° °

## 2015-02-13 NOTE — Assessment & Plan Note (Signed)
BP is improved since last visit.  Pt is requesting letter for his employer that medically justifies moving his shift.  Letter written and given to pt.

## 2015-02-13 NOTE — Progress Notes (Signed)
   Subjective:    Patient ID: George Needle., male    DOB: [birth year], 49 y.o.   MRN: [id number]  HPI HTN- ongoing issue for pt.  He is attempting to have his shift changed due to HTN and insomnia.  Pt is requesting a letter indicating the medical necessity for the changes.   Review of Systems For ROS see HPI     Objective:   Physical Exam  Constitutional: He is oriented to person, place, and time. He appears well-developed and well-nourished. No distress.  HENT:  Head: Normocephalic and atraumatic.  Neurological: He is alert and oriented to person, place, and time.  Psychiatric: He has a normal mood and affect. His behavior is normal. Thought content normal.  Vitals reviewed.         Assessment & Plan:

## 2015-02-13 NOTE — Progress Notes (Signed)
Pre visit review using our clinic review tool, if applicable. No additional management support is needed unless otherwise documented below in the visit note. 

## 2015-03-07 ENCOUNTER — Other Ambulatory Visit: Payer: Self-pay | Admitting: Family Medicine

## 2015-03-07 NOTE — Telephone Encounter (Signed)
Medication filled to pharmacy as requested.   

## 2015-03-08 ENCOUNTER — Telehealth: Payer: Self-pay | Admitting: *Deleted

## 2015-03-08 NOTE — Telephone Encounter (Signed)
PA initiated, awaiting determination. JG//CMA  

## 2015-03-08 NOTE — Telephone Encounter (Signed)
Approved effective from 03/08/2015 through 03/22/2038. JG//CMA

## 2015-04-11 ENCOUNTER — Ambulatory Visit: Payer: BLUE CROSS/BLUE SHIELD | Admitting: Family Medicine

## 2015-04-11 ENCOUNTER — Telehealth: Payer: Self-pay | Admitting: Family Medicine

## 2015-04-12 NOTE — Telephone Encounter (Signed)
Pt was no show 04/11/15 2:30pm follow up appt, pt called in 9:21am 1/19 stating wife having surgery at 1:00 today and he can not come to appt, charge or no charge? Pt is rescheduled for 04/18/15

## 2015-04-12 NOTE — Telephone Encounter (Signed)
No charge. 

## 2015-04-18 ENCOUNTER — Ambulatory Visit (INDEPENDENT_AMBULATORY_CARE_PROVIDER_SITE_OTHER): Payer: BLUE CROSS/BLUE SHIELD | Admitting: Family Medicine

## 2015-04-18 ENCOUNTER — Encounter: Payer: Self-pay | Admitting: Family Medicine

## 2015-04-18 VITALS — BP 136/96 | HR 60 | Temp 97.5°F | Ht 70.0 in | Wt 227.6 lb

## 2015-04-18 DIAGNOSIS — I10 Essential (primary) hypertension: Secondary | ICD-10-CM

## 2015-04-18 LAB — LIPID PANEL
CHOL/HDL RATIO: 4
CHOLESTEROL: 136 mg/dL (ref 0–200)
HDL: 36.4 mg/dL — AB (ref >39.00)
LDL CALC: 82 mg/dL (ref 0–99)
NonHDL: 99.87
TRIGLYCERIDES: 89 mg/dL (ref 0.0–149.0)
VLDL: 17.8 mg/dL (ref 0.0–40.0)

## 2015-04-18 LAB — HEPATIC FUNCTION PANEL
ALBUMIN: 4.3 g/dL (ref 3.5–5.2)
ALT: 20 U/L (ref 0–53)
AST: 12 U/L (ref 0–37)
Alkaline Phosphatase: 64 U/L (ref 39–117)
Bilirubin, Direct: 0.2 mg/dL (ref 0.0–0.3)
Total Bilirubin: 0.8 mg/dL (ref 0.2–1.2)
Total Protein: 6.1 g/dL (ref 6.0–8.3)

## 2015-04-18 LAB — CBC WITH DIFFERENTIAL/PLATELET
BASOS ABS: 0 10*3/uL (ref 0.0–0.1)
Basophils Relative: 0.4 % (ref 0.0–3.0)
Eosinophils Absolute: 0.1 10*3/uL (ref 0.0–0.7)
Eosinophils Relative: 1.1 % (ref 0.0–5.0)
HCT: 46.8 % (ref 39.0–52.0)
HEMOGLOBIN: 15 g/dL (ref 13.0–17.0)
LYMPHS ABS: 3.9 10*3/uL (ref 0.7–4.0)
Lymphocytes Relative: 31.9 % (ref 12.0–46.0)
MCHC: 32.2 g/dL (ref 30.0–36.0)
MCV: 84 fl (ref 78.0–100.0)
MONO ABS: 0.8 10*3/uL (ref 0.1–1.0)
MONOS PCT: 6.9 % (ref 3.0–12.0)
NEUTROS PCT: 59.7 % (ref 43.0–77.0)
Neutro Abs: 7.2 10*3/uL (ref 1.4–7.7)
Platelets: 223 10*3/uL (ref 150.0–400.0)
RBC: 5.57 Mil/uL (ref 4.22–5.81)
RDW: 14.6 % (ref 11.5–15.5)
WBC: 12.1 10*3/uL — AB (ref 4.0–10.5)

## 2015-04-18 LAB — TSH: TSH: 2.25 u[IU]/mL (ref 0.35–4.50)

## 2015-04-18 LAB — BASIC METABOLIC PANEL
BUN: 19 mg/dL (ref 6–23)
CO2: 29 mEq/L (ref 19–32)
CREATININE: 0.94 mg/dL (ref 0.40–1.50)
Calcium: 9.1 mg/dL (ref 8.4–10.5)
Chloride: 106 mEq/L (ref 96–112)
GFR: 90.26 mL/min (ref >60.00)
GLUCOSE: 87 mg/dL (ref 70–99)
POTASSIUM: 4.1 meq/L (ref 3.5–5.1)
Sodium: 141 mEq/L (ref 135–145)

## 2015-04-18 NOTE — Patient Instructions (Signed)
Follow up in 6-8 weeks to recheck BP We'll notify you of your lab results and make any changes if needed Restart the Losartan daily Call with any questions or concerns Happy [place] Birthday!!!

## 2015-04-18 NOTE — Progress Notes (Signed)
   Subjective:    Patient ID: George Needle., male    DOB: July 15, 1965, 50 y.o.   MRN: [id number]  HPI HTN- chronic problem, losartan was decreased to 25mg  last visit at pt's request b/c he wants to wean off medication.  Pt is now off meds entirely.  BP is elevated today.  Pt was convinced that his elevated BP was due to working 3rd shift.  Pt reports increased stress b/c wife recently had back surgery.  Denies HAs.  + fatigue.  No CP, SOB, visual changes, edema.   Review of Systems For ROS see HPI     Objective:   Physical Exam  Constitutional: He is oriented to person, place, and time. He appears well-developed and well-nourished. No distress.  HENT:  Head: Normocephalic and atraumatic.  Eyes: Conjunctivae and EOM are normal. Pupils are equal, round, and reactive to light.  Neck: Normal range of motion. Neck supple. No thyromegaly present.  Cardiovascular: Normal rate, regular rhythm, normal heart sounds and intact distal pulses.   No murmur heard. Pulmonary/Chest: Effort normal and breath sounds normal. No respiratory distress.  Abdominal: Soft. Bowel sounds are normal. He exhibits no distension.  Musculoskeletal: He exhibits no edema.  Lymphadenopathy:    He has no cervical adenopathy.  Neurological: He is alert and oriented to person, place, and time. No cranial nerve deficit.  Skin: Skin is warm and dry.  Psychiatric: He has a normal mood and affect. His behavior is normal.  Vitals reviewed.         Assessment & Plan:

## 2015-04-18 NOTE — Assessment & Plan Note (Signed)
Deteriorated.  BP is again elevated since stopping Losartan.  Restart 25mg  daily as pt reports not feeling as well since stopping meds and BP rising.  Check labs.  Will follow closely.

## 2015-04-18 NOTE — Progress Notes (Signed)
Pre visit review using our clinic review tool, if applicable. No additional management support is needed unless otherwise documented below in the visit note. 

## 2015-04-19 ENCOUNTER — Encounter: Payer: Self-pay | Admitting: General Practice

## 2015-06-28 ENCOUNTER — Encounter: Payer: Self-pay | Admitting: Family Medicine

## 2015-06-28 ENCOUNTER — Ambulatory Visit (INDEPENDENT_AMBULATORY_CARE_PROVIDER_SITE_OTHER): Payer: BLUE CROSS/BLUE SHIELD | Admitting: Family Medicine

## 2015-06-28 VITALS — BP 126/86 | HR 55 | Temp 98.1°F | Resp 16 | Ht 70.0 in | Wt 239.0 lb

## 2015-06-28 DIAGNOSIS — R140 Abdominal distension (gaseous): Secondary | ICD-10-CM | POA: Insufficient documentation

## 2015-06-28 DIAGNOSIS — Z23 Encounter for immunization: Secondary | ICD-10-CM | POA: Diagnosis not present

## 2015-06-28 DIAGNOSIS — Z0001 Encounter for general adult medical examination with abnormal findings: Secondary | ICD-10-CM | POA: Diagnosis not present

## 2015-06-28 DIAGNOSIS — Z0000 Encounter for general adult medical examination without abnormal findings: Secondary | ICD-10-CM | POA: Diagnosis not present

## 2015-06-28 LAB — LIPID PANEL
CHOL/HDL RATIO: 4
Cholesterol: 183 mg/dL (ref 0–200)
HDL: 41.1 mg/dL (ref >39.00)
LDL Cholesterol: 123 mg/dL — ABNORMAL HIGH (ref 0–99)
NONHDL: 141.44
Triglycerides: 90 mg/dL (ref 0.0–149.0)
VLDL: 18 mg/dL (ref 0.0–40.0)

## 2015-06-28 LAB — CBC WITH DIFFERENTIAL/PLATELET
BASOS PCT: 0.3 % (ref 0.0–3.0)
Basophils Absolute: 0 10*3/uL (ref 0.0–0.1)
EOS PCT: 3.1 % (ref 0.0–5.0)
Eosinophils Absolute: 0.4 10*3/uL (ref 0.0–0.7)
HEMATOCRIT: 48.5 % (ref 39.0–52.0)
HEMOGLOBIN: 15.9 g/dL (ref 13.0–17.0)
Lymphocytes Relative: 25.4 % (ref 12.0–46.0)
Lymphs Abs: 2.9 10*3/uL (ref 0.7–4.0)
MCHC: 32.8 g/dL (ref 30.0–36.0)
MCV: 83.8 fl (ref 78.0–100.0)
MONO ABS: 0.7 10*3/uL (ref 0.1–1.0)
Monocytes Relative: 6.5 % (ref 3.0–12.0)
NEUTROS ABS: 7.4 10*3/uL (ref 1.4–7.7)
Neutrophils Relative %: 64.7 % (ref 43.0–77.0)
Platelets: 205 10*3/uL (ref 150.0–400.0)
RBC: 5.79 Mil/uL (ref 4.22–5.81)
RDW: 14.6 % (ref 11.5–15.5)
WBC: 11.4 10*3/uL — AB (ref 4.0–10.5)

## 2015-06-28 LAB — HEPATIC FUNCTION PANEL
ALT: 25 U/L (ref 0–53)
AST: 18 U/L (ref 0–37)
Albumin: 4.9 g/dL (ref 3.5–5.2)
Alkaline Phosphatase: 88 U/L (ref 39–117)
BILIRUBIN DIRECT: 0.2 mg/dL (ref 0.0–0.3)
BILIRUBIN TOTAL: 1.1 mg/dL (ref 0.2–1.2)
Total Protein: 6.7 g/dL (ref 6.0–8.3)

## 2015-06-28 LAB — BASIC METABOLIC PANEL
BUN: 14 mg/dL (ref 6–23)
CO2: 29 mEq/L (ref 19–32)
CREATININE: 0.85 mg/dL (ref 0.40–1.50)
Calcium: 9.8 mg/dL (ref 8.4–10.5)
Chloride: 102 mEq/L (ref 96–112)
GFR: 101.29 mL/min (ref >60.00)
Glucose, Bld: 82 mg/dL (ref 70–99)
Potassium: 4.4 mEq/L (ref 3.5–5.1)
SODIUM: 141 meq/L (ref 135–145)

## 2015-06-28 LAB — TSH: TSH: 3.73 u[IU]/mL (ref 0.35–4.50)

## 2015-06-28 LAB — PSA: PSA: 0.59 ng/mL (ref 0.10–4.00)

## 2015-06-28 MED ORDER — BUDESONIDE-FORMOTEROL FUMARATE 80-4.5 MCG/ACT IN AERO: INHALATION_SPRAY | RESPIRATORY_TRACT | Status: DC

## 2015-06-28 MED ORDER — ALBUTEROL SULFATE HFA 108 (90 BASE) MCG/ACT IN AERS: INHALATION_SPRAY | RESPIRATORY_TRACT | Status: DC

## 2015-06-28 NOTE — Patient Instructions (Signed)
Follow up in 6 months to recheck BP We'll notify you of your lab results and make any changes if needed Continue to work on healthy diet and regular exercise If the down days start out numbering the good days or you want to try something for anxiety, let me know! We'll call you with the GI appt for the shortness of breath after eating Call with any questions or concerns Thanks for sticking with [place]! Happy Spring!!!

## 2015-06-28 NOTE — Progress Notes (Signed)
   Subjective:    Patient ID: George Needle., male    DOB: 1965-10-18, 50 y.o.   MRN: [id number]  HPI CPE- pt declines colonoscopy, willing to do cologuard.  No concerns today   Review of Systems Patient reports no vision/hearing changes, anorexia, fever ,adenopathy, persistant/recurrent hoarseness, swallowing issues, chest pain, palpitations, edema, persistant/recurrent cough, hemoptysis, gastrointestinal  bleeding (melena, rectal bleeding), abdominal pain, excessive heart burn, GU symptoms (dysuria, hematuria, voiding/incontinence issues) syncope, focal weakness, memory loss, numbness & tingling, skin/hair/nail changes, abnormal bruising/bleeding, musculoskeletal symptoms/signs.   + SOB w/ eating + anxiety- wife has been in hospital a lot recently and this has been very stressful    Objective:   Physical Exam BP 126/86 mmHg  Pulse 55  Temp(Src) 98.1 F (36.7 C) (Oral)  Resp 16  Ht 5\' 10"  (1.778 m)  Wt 239 lb (108.41 kg)  BMI 34.29 kg/m2  SpO2 93%  General Appearance:    Alert, cooperative, no distress, appears stated age, obese  Head:    Normocephalic, without obvious abnormality, atraumatic  Eyes:    PERRL, conjunctiva/corneas clear, EOM's intact, fundi    benign, both eyes       Ears:    Normal TM's and external ear canals, both ears  Nose:   Nares normal, septum midline, mucosa normal, no drainage   or sinus tenderness  Throat:   Lips, mucosa, and tongue normal; teeth and gums normal  Neck:   Supple, symmetrical, trachea midline, no adenopathy;       thyroid:  No enlargement/tenderness/nodules  Back:     Symmetric, no curvature, ROM normal, no CVA tenderness  Lungs:     Clear to auscultation bilaterally, respirations unlabored  Chest wall:    No tenderness or deformity  Heart:    Regular rate and rhythm, S1 and S2 normal, no murmur, rub   or gallop  Abdomen:     Soft, non-tender, bowel sounds active all four quadrants,    no masses, no organomegaly  Genitalia:     Normal male without lesion, discharge or tenderness  Rectal:    Normal tone, normal prostate, no masses or tenderness  Extremities:   Extremities normal, atraumatic, no cyanosis or edema  Pulses:   2+ and symmetric all extremities  Skin:   Skin color, texture, turgor normal, no rashes or lesions  Lymph nodes:   Cervical, supraclavicular, and axillary nodes normal  Neurologic:   CNII-XII intact. Normal strength, sensation and reflexes      throughout          Assessment & Plan:

## 2015-06-28 NOTE — Progress Notes (Signed)
Pre visit review using our clinic review tool, if applicable. No additional management support is needed unless otherwise documented below in the visit note. 

## 2015-06-29 NOTE — Assessment & Plan Note (Signed)
Pt's PE WNL w/ exception of obesity.  Due for colonoscopy but prefers cologuard.  Tdap updated today.  Check labs.  Anticipatory guidance provided.

## 2015-06-29 NOTE — Assessment & Plan Note (Signed)
New.  Pt reports that after eating almost any meal he will have SOB.  Unclear if pt has hiatal hernia but this is becoming more bothersome to him and warrants a GI evaluation.  Pt expressed understanding and is in agreement w/ plan.

## 2015-07-01 ENCOUNTER — Encounter: Payer: Self-pay | Admitting: General Practice

## 2015-07-03 ENCOUNTER — Encounter: Payer: Self-pay | Admitting: Internal Medicine

## 2015-07-08 ENCOUNTER — Telehealth: Payer: Self-pay | Admitting: Family Medicine

## 2015-07-08 NOTE — Telephone Encounter (Signed)
Pt states that his home screening kit that he received by mail was destroyed and asking if a new one could be ordered for him.  °

## 2015-07-08 NOTE — Telephone Encounter (Signed)
Called pt and left a detailed message on his voicemail informing him of the cologuard pt assistance number.

## 2015-07-15 ENCOUNTER — Telehealth: Payer: Self-pay | Admitting: Family Medicine

## 2015-07-15 NOTE — Telephone Encounter (Signed)
Pt states that his BP has been running high and states that he is going to start back on meds until he can lose some weight then he will try again to stop BP meds.

## 2015-07-15 NOTE — Telephone Encounter (Signed)
Ok to resend Losartan

## 2015-07-15 NOTE — Telephone Encounter (Signed)
Please advise? Ok to resend Losartan?

## 2015-07-16 MED ORDER — LOSARTAN POTASSIUM 25 MG PO TABS: 25.0000 mg | ORAL_TABLET | Freq: Every day | ORAL | Status: DC

## 2015-07-16 NOTE — Telephone Encounter (Signed)
Medication filled to pharmacy as requested.   

## 2015-07-22 LAB — COLOGUARD: COLOGUARD: NEGATIVE

## 2015-07-26 LAB — COLOGUARD

## 2015-08-12 ENCOUNTER — Encounter: Payer: Self-pay | Admitting: General Practice

## 2015-08-12 ENCOUNTER — Telehealth: Payer: Self-pay | Admitting: Family Medicine

## 2015-08-12 NOTE — Telephone Encounter (Signed)
Patient notified of PCP recommendations and is agreement and expresses an understanding.  

## 2015-08-12 NOTE — Telephone Encounter (Signed)
Pt calling for results from cologuard test

## 2015-08-23 ENCOUNTER — Encounter: Payer: Self-pay | Admitting: Family Medicine

## 2015-09-03 ENCOUNTER — Encounter: Payer: Self-pay | Admitting: Internal Medicine

## 2015-09-03 ENCOUNTER — Ambulatory Visit (INDEPENDENT_AMBULATORY_CARE_PROVIDER_SITE_OTHER): Payer: BLUE CROSS/BLUE SHIELD | Admitting: Internal Medicine

## 2015-09-03 VITALS — BP 104/70 | HR 80 | Ht 70.0 in | Wt 241.0 lb

## 2015-09-03 DIAGNOSIS — R140 Abdominal distension (gaseous): Secondary | ICD-10-CM | POA: Diagnosis not present

## 2015-09-03 DIAGNOSIS — R1013 Epigastric pain: Secondary | ICD-10-CM

## 2015-09-03 NOTE — Patient Instructions (Addendum)
You have been scheduled for an Upper GI Series at [facility]. Your appointment is on 09-06-2015 at 10:00am . Please arrive 15 minutes prior to your test for registration. Make sure not to eat or drink anything after midnight on the night before your test. If you need to reschedule, please call radiology at [phone]. ________________________________________________________________ An upper GI series uses x rays to help diagnose problems of the upper GI tract, which includes the esophagus, stomach, and duodenum. The duodenum is the first part of the small intestine. An upper GI series is conducted by a radiology technologist or a radiologist-a doctor who specializes in x-ray imaging-at a hospital or outpatient center. While sitting or standing in front of an x-ray machine, the patient drinks barium liquid, which is often white and has a chalky consistency and taste. The barium liquid coats the lining of the upper GI tract and makes signs of disease show up more clearly on x rays. X-ray video, called fluoroscopy, is used to view the barium liquid moving through the esophagus, stomach, and duodenum. Additional x rays and fluoroscopy are performed while the patient lies on an x-ray table. To fully coat the upper GI tract with barium liquid, the technologist or radiologist may press on the abdomen or ask the patient to change position. Patients hold still in various positions, allowing the technologist or radiologist to take x rays of the upper GI tract at different angles. If a technologist conducts the upper GI series, a radiologist will later examine the images to look for problems.  This test typically takes about 1 hour to complete. __________________________________________________________________   I appreciate the opportunity to care for you. Silvano Rusk, MD, [facility]

## 2015-09-03 NOTE — Progress Notes (Signed)
  Referred [id number] Wadie Lessen, MD [address] [place], [place] [address]  Subjective:    Patient ID: [name]., male    DOB: 1965/04/05, 50 y.o.   MRN: [id number] Cc: bloating HPI 50 yo man with post-prandial upper abdominal swelling and bloating - often occurs after meals but no consistent trigger. Can have associated dyspnea.Has stopped cow's milk and switched to almon milk and no help. Weight has fluctuated. Working at night which is a stress physically. Belching does not occur much and doesn't help much.  Medications, allergies, past medical history, past surgical history, family history and social history are reviewed and updated in the EMR.  Review of Systems As above and all; other negative    Objective:   Physical Exam @BP  104/70 mmHg  Pulse 80  Ht 5\' 10"  (1.778 m)  Wt 241 lb (109.317 kg)  BMI 34.58 kg/m2@  General:  Well-developed, well-nourished and in no acute distress Eyes:  anicteric. ENT:   Mouth and posterior pharynx free of lesions.  Neck:   supple w/o thyromegaly or mass.  Lungs: Clear to auscultation bilaterally. Heart:  S1S2, no rubs, murmurs, gallops. Abdomen:  soft, non-tender, no hepatosplenomegaly, hernia, or mass and BS+.  Lymph:  no cervical or supraclavicular adenopathy. Extremities:   no edema, cyanosis or clubbing Skin   no rash. Neuro:  A&O x 3.  Psych:  appropriate mood and  Affect.   Data Reviewed: PCP NOTES 2017 LABS EMR 2017      Assessment & Plan:   Encounter Diagnoses  Name Primary?  . Abdominal pain, epigastric Yes  . Bloating    Evaluate w/ Upper GI seies - further plans pending that.  I appreciate the opportunity to care for this patient.  [id number] Birdie Riddle, MD

## 2015-09-05 ENCOUNTER — Encounter: Payer: Self-pay | Admitting: Internal Medicine

## 2015-09-06 ENCOUNTER — Ambulatory Visit (HOSPITAL_COMMUNITY)
Admission: RE | Admit: 2015-09-06 | Discharge: 2015-09-06 | Disposition: A | Discharge status: Home / Self Care | Payer: BLUE CROSS/BLUE SHIELD | Source: Ambulatory Visit | Attending: Internal Medicine | Admitting: Internal Medicine

## 2015-09-06 DIAGNOSIS — K449 Diaphragmatic hernia without obstruction or gangrene: Secondary | ICD-10-CM | POA: Insufficient documentation

## 2015-09-06 DIAGNOSIS — R1013 Epigastric pain: Secondary | ICD-10-CM | POA: Diagnosis present

## 2015-09-06 DIAGNOSIS — R140 Abdominal distension (gaseous): Secondary | ICD-10-CM | POA: Insufficient documentation

## 2015-09-06 NOTE — Progress Notes (Signed)
Quick Note:  This looks ok except small hiatal hernia which is not causing his sxs  Not sure that it is truly a GI prolem actually -   He can try dicyclomine 20 mg qac # 90 no refill to see if that helps  If that helps we can refill  If it doesn't let me know ______

## 2015-09-13 ENCOUNTER — Telehealth: Payer: Self-pay

## 2015-09-13 NOTE — Telephone Encounter (Signed)
Advised. Declines medication at this point. He plans to lose weight first to see if that helps with his symptoms.

## 2015-09-13 NOTE — Telephone Encounter (Signed)
-----   Message from Gatha Mayer, MD sent at 09/06/2015  4:36 PM EDT ----- This looks ok except small hiatal hernia which is not causing his sxs  Not sure that it is truly a GI prolem actually -   He can try dicyclomine 20 mg qac # 90 no refill to see if that helps  If that helps we can refill  If it doesn't let me know

## 2015-11-07 ENCOUNTER — Other Ambulatory Visit: Payer: Self-pay | Admitting: Family Medicine

## 2015-11-21 ENCOUNTER — Encounter: Payer: Self-pay | Admitting: Family Medicine

## 2015-11-21 ENCOUNTER — Ambulatory Visit (INDEPENDENT_AMBULATORY_CARE_PROVIDER_SITE_OTHER): Payer: BLUE CROSS/BLUE SHIELD | Admitting: Family Medicine

## 2015-11-21 ENCOUNTER — Encounter: Payer: Self-pay | Admitting: Internal Medicine

## 2015-11-21 VITALS — BP 138/88 | HR 83 | Temp 98.0°F | Resp 16 | Ht 70.0 in | Wt 245.4 lb

## 2015-11-21 DIAGNOSIS — J0100 Acute maxillary sinusitis, unspecified: Secondary | ICD-10-CM | POA: Diagnosis not present

## 2015-11-21 DIAGNOSIS — I10 Essential (primary) hypertension: Secondary | ICD-10-CM | POA: Diagnosis not present

## 2015-11-21 MED ORDER — DICYCLOMINE HCL 10 MG PO CAPS: 10.0000 mg | ORAL_CAPSULE | Freq: Three times a day (TID) | ORAL | 3 refills | Status: DC

## 2015-11-21 MED ORDER — AZITHROMYCIN 250 MG PO TABS: ORAL_TABLET | ORAL | 0 refills | Status: DC

## 2015-11-21 MED ORDER — LOSARTAN POTASSIUM 25 MG PO TABS: 25.0000 mg | ORAL_TABLET | Freq: Every day | ORAL | 6 refills | Status: DC

## 2015-11-21 NOTE — Patient Instructions (Signed)
Follow up in 3-4 weeks to recheck BP Start the [place]stone as directed Continue the allergy meds daily Drink plenty of fluids Restart the Losartan daily for better BP control Continue to work on healthy diet and regular exercise- you can do it!!! Call with any questions or concerns Happy Labor Day!!!

## 2015-11-21 NOTE — Progress Notes (Signed)
Pre visit review using our clinic review tool, if applicable. No additional management support is needed unless otherwise documented below in the visit note. 

## 2015-11-21 NOTE — Progress Notes (Signed)
   Subjective:    Patient ID: George Needle., male    DOB: 25-Apr-1965, 50 y.o.   MRN: [id number]  HPI HTN- chronic problem, pt is currently on Losartan 25mg  daily but he just recently restarted it.  He was trying to go off of all BP meds.  Home BPs are running 140-150/90.  Pt just recently got new BP cuff.  Yesterday had 'a pounding HA'.  Denies CP, SOB, visual changes, edema.  URI- sxs started 7-10 days ago.  Cough is productive.  + HA.  Denies sinus pain/pressure.  + nasal congestion.  No ear pain.  + tooth pain.  Review of Systems For ROS see HPI     Objective:   Physical Exam  Constitutional: He is oriented to person, place, and time. He appears well-developed and well-nourished. No distress.  HENT:  Head: Normocephalic and atraumatic.  Mild TTP over sinuses + turbinate edema + PND TMs normal bilaterally  Eyes: Conjunctivae and EOM are normal. Pupils are equal, round, and reactive to light.  Neck: Normal range of motion. Neck supple.  Cardiovascular: Normal rate, regular rhythm and normal heart sounds.   Pulmonary/Chest: Effort normal and breath sounds normal. No respiratory distress. He has no wheezes.  No cough heard  Lymphadenopathy:    He has no cervical adenopathy.  Neurological: He is alert and oriented to person, place, and time.  Skin: Skin is warm and dry.  Psychiatric: He has a normal mood and affect. His behavior is normal. Thought content normal.  Vitals reviewed.         Assessment & Plan:

## 2015-11-21 NOTE — Assessment & Plan Note (Signed)
Deteriorated.  Pt's home BPs are higher than previously since attempting to wean off Losartan.  Will restart 25mg  daily and monitor for improvement.  Stressed need for healthy diet and regular exercise.  Will continue to follow closely.

## 2015-11-21 NOTE — Assessment & Plan Note (Signed)
New.  Pt's sxs and PE consistent w/ sinus infxn.  This is likely contributing to his elevated BP.  Start abx.  Continue daily allergy medication.  Reviewed supportive care and red flags that should prompt return.  Pt expressed understanding and is in agreement w/ plan.

## 2015-12-18 ENCOUNTER — Encounter: Payer: Self-pay | Admitting: Family Medicine

## 2015-12-18 ENCOUNTER — Ambulatory Visit (INDEPENDENT_AMBULATORY_CARE_PROVIDER_SITE_OTHER): Payer: BLUE CROSS/BLUE SHIELD | Admitting: Family Medicine

## 2015-12-18 VITALS — BP 128/86 | HR 69 | Temp 98.1°F | Resp 16 | Ht 70.0 in | Wt 243.5 lb

## 2015-12-18 DIAGNOSIS — N529 Male erectile dysfunction, unspecified: Secondary | ICD-10-CM | POA: Insufficient documentation

## 2015-12-18 DIAGNOSIS — N521 Erectile dysfunction due to diseases classified elsewhere: Secondary | ICD-10-CM | POA: Diagnosis not present

## 2015-12-18 DIAGNOSIS — Z23 Encounter for immunization: Secondary | ICD-10-CM

## 2015-12-18 DIAGNOSIS — I10 Essential (primary) hypertension: Secondary | ICD-10-CM

## 2015-12-18 MED ORDER — SILDENAFIL CITRATE 20 MG PO TABS: ORAL_TABLET | ORAL | 1 refills | Status: DC

## 2015-12-18 NOTE — Progress Notes (Signed)
Pre visit review using our clinic review tool, if applicable. No additional management support is needed unless otherwise documented below in the visit note. 

## 2015-12-18 NOTE — Patient Instructions (Signed)
Schedule your complete physical for April Continue the Losartan daily- BP looks good!!! Continue to work on healthy diet and regular exercise- you can do it!!! Call with any questions or concerns Happy Fall!!!

## 2015-12-18 NOTE — Progress Notes (Addendum)
   Subjective:    Patient ID: George Needle., male    DOB: 1965-06-09, 50 y.o.   MRN: [id number]  HPI HTN- chronic problem.  Pt restarted Losartan 25mg  at last visit.  Pt reports feeling better- no longer having HAs.  Sleeping better.  No CP, SOB, HAs, visual changes, edema.  Pt has lost a few lbs since last visit.  ED- pt has anniversary trip next month and is asking for sildenafil due to difficulty maintaining erection.  Review of Systems For ROS see HPI     Objective:   Physical Exam  Constitutional: He is oriented to person, place, and time. He appears well-developed and well-nourished. No distress.  obese  HENT:  Head: Normocephalic and atraumatic.  Eyes: Conjunctivae and EOM are normal. Pupils are equal, round, and reactive to light.  Neck: Normal range of motion. Neck supple. No thyromegaly present.  Cardiovascular: Normal rate, regular rhythm, normal heart sounds and intact distal pulses.   No murmur heard. Pulmonary/Chest: Effort normal and breath sounds normal. No respiratory distress.  Abdominal: Soft. Bowel sounds are normal. He exhibits no distension.  Musculoskeletal: He exhibits no edema.  Lymphadenopathy:    He has no cervical adenopathy.  Neurological: He is alert and oriented to person, place, and time. No cranial nerve deficit.  Skin: Skin is warm and dry.  Psychiatric: He has a normal mood and affect. His behavior is normal.  Vitals reviewed.         Assessment & Plan:

## 2015-12-18 NOTE — Addendum Note (Signed)
Addended by: Midge Minium on: 12/18/2015 01:40 PM   Modules accepted: Orders

## 2015-12-18 NOTE — Assessment & Plan Note (Signed)
Chronic problem.  BP is better controlled today.  Asymptomatic.  No med changes at this time.  Will follow.

## 2015-12-18 NOTE — Assessment & Plan Note (Signed)
New.  Secondary to low T.  Prescription for Sildenafil given along w/ instructions for use.  Pt expressed understanding and is in agreement w/ plan.

## 2015-12-24 ENCOUNTER — Encounter: Payer: Self-pay | Admitting: Family Medicine

## 2015-12-27 ENCOUNTER — Encounter: Payer: Self-pay | Admitting: Family Medicine

## 2016-02-18 ENCOUNTER — Encounter: Payer: Self-pay | Admitting: Family Medicine

## 2016-04-14 ENCOUNTER — Encounter: Payer: Self-pay | Admitting: Physician Assistant

## 2016-04-14 ENCOUNTER — Encounter: Payer: Self-pay | Admitting: Family Medicine

## 2016-04-14 ENCOUNTER — Ambulatory Visit (INDEPENDENT_AMBULATORY_CARE_PROVIDER_SITE_OTHER): Payer: BLUE CROSS/BLUE SHIELD | Admitting: Physician Assistant

## 2016-04-14 VITALS — BP 122/86 | HR 84 | Temp 97.8°F | Resp 16 | Ht 70.0 in | Wt 241.0 lb

## 2016-04-14 DIAGNOSIS — R350 Frequency of micturition: Secondary | ICD-10-CM

## 2016-04-14 DIAGNOSIS — L853 Xerosis cutis: Secondary | ICD-10-CM

## 2016-04-14 DIAGNOSIS — B356 Tinea cruris: Secondary | ICD-10-CM

## 2016-04-14 LAB — HEMOGLOBIN A1C: Hgb A1c MFr Bld: 5.6 % (ref 4.6–6.5)

## 2016-04-14 LAB — COMPREHENSIVE METABOLIC PANEL
ALBUMIN: 4.1 g/dL (ref 3.5–5.2)
ALK PHOS: 70 U/L (ref 39–117)
ALT: 32 U/L (ref 0–53)
AST: 22 U/L (ref 0–37)
BILIRUBIN TOTAL: 0.6 mg/dL (ref 0.2–1.2)
BUN: 11 mg/dL (ref 6–23)
CALCIUM: 9.2 mg/dL (ref 8.4–10.5)
CO2: 33 meq/L — AB (ref 19–32)
CREATININE: 0.84 mg/dL (ref 0.40–1.50)
Chloride: 105 mEq/L (ref 96–112)
GFR: 102.36 mL/min (ref >60.00)
Glucose, Bld: 120 mg/dL — ABNORMAL HIGH (ref 70–99)
Potassium: 4.1 mEq/L (ref 3.5–5.1)
Sodium: 140 mEq/L (ref 135–145)
TOTAL PROTEIN: 6 g/dL (ref 6.0–8.3)

## 2016-04-14 LAB — POCT URINALYSIS DIPSTICK
BILIRUBIN UA: NEGATIVE
Blood, UA: NEGATIVE
GLUCOSE UA: NEGATIVE
Ketones, UA: NEGATIVE
LEUKOCYTES UA: NEGATIVE
NITRITE UA: NEGATIVE
Protein, UA: NEGATIVE
Spec Grav, UA: 1.02
Urobilinogen, UA: 0.2
pH, UA: 5

## 2016-04-14 LAB — TSH: TSH: 1.22 u[IU]/mL (ref 0.35–4.50)

## 2016-04-14 MED ORDER — CLOTRIMAZOLE-BETAMETHASONE 1-0.05 % EX CREA: 1.0000 "application " | TOPICAL_CREAM | Freq: Two times a day (BID) | CUTANEOUS | 0 refills | Status: DC

## 2016-04-14 NOTE — Patient Instructions (Addendum)
Please use lotrisone cream as directed for rash resolution. Do not use more than 2 weeks. Do not apply to genitals. Only inner leg.   Get a moisturizing lotion like Eucerin to use for itchy skin of rest of the body. Do not see any rash in this areas.  Will check lab panel today to assess itching and fatigue.  Will alter regimen based on results.

## 2016-04-14 NOTE — Progress Notes (Signed)
Pre visit review using our clinic review tool, if applicable. No additional management support is needed unless otherwise documented below in the visit note. 

## 2016-04-14 NOTE — Progress Notes (Signed)
Patient presents to clinic today c/o rash of arms, legs and groin over the past week. Endorses itching but denies pain. Denies change to soaps, lotions, detergents or other products. Denies new pets. Denies recent travel or contact with similar symptoms. Patient states he feels rash is yeast. Is concerned for diabetes as it runs in his family. Patient notes some urinary frequency without urgency, dysuria. Denies polyphagia. Endorses some slight increase in thirst but nothing excessive.    Past Medical History:  Diagnosis Date  . Asthma   . Gout   . H/O vasectomy   . Hypertension     Current Outpatient Prescriptions on File Prior to Visit  Medication Sig Dispense Refill  . albuterol (PROAIR HFA) 108 (90 Base) MCG/ACT inhaler INHALE TWO   PUFFS INTO THE LUNGS EVERY SIX HOURS   AS NEEDED   FOR WHEEZING 2 Inhaler 6  . budesonide-formoterol (SYMBICORT) 80-4.5 MCG/ACT inhaler INHALE TWO   PUFFS INTO THE LUNGS TWO   (TWO) TIMES DAILY. 2 Inhaler 3  . dicyclomine (BENTYL) 10 MG capsule Take 1 capsule (10 mg total) by mouth 3 (three) times daily before meals. 90 capsule 3  . loratadine (ALLERGY RELIEF) 10 MG tablet Take 10 mg by mouth daily.    Marland Kitchen losartan (COZAAR) 25 MG tablet Take 1 tablet (25 mg total) by mouth daily. 30 tablet 6  . sildenafil (REVATIO) 20 MG tablet 3 tabs daily as needed for ED 60 tablet 1  . omeprazole (PRILOSEC) 40 MG capsule TAKE 1 CAPSULE (40 MG TOTAL) BY MOUTH DAILY. (Patient not taking: Reported on 04/14/2016) 30 capsule 6   No current facility-administered medications on file prior to visit.     No Known Allergies  Family History  Problem Relation Age of Onset  . Hypertension Mother   . Diabetes Mother   . Hypertension Father   . Prostate cancer Paternal Uncle     Social History   Social History  . Marital status: Married    Spouse name: N/A  . Number of children: N/A  . Years of education: N/A   Occupational History  . printing     Social History Main  Topics  . Smoking status: Never Smoker  . Smokeless tobacco: Never Used  . Alcohol use 0.0 oz/week     Comment: occ.  . Drug use: No  . Sexual activity: Not Asked   Other Topics Concern  . None   Social History Narrative  . None    Review of Systems - See HPI.  All other ROS are negative.  BP 122/86   Pulse 84   Temp 97.8 F (36.6 C) (Oral)   Resp 16   Ht '5\' 10"'  (1.778 m)   Wt 241 lb (109.3 kg)   SpO2 98%   BMI 34.58 kg/m   Physical Exam  Constitutional: He is oriented to person, place, and time and well-developed, well-nourished, and in no distress.  HENT:  Head: Normocephalic and atraumatic.  Nose: Nose normal.  Mouth/Throat: Oropharynx is clear and moist. No oropharyngeal exudate.  Eyes: Conjunctivae are normal.  Neck: Neck supple.  Cardiovascular: Normal rate, regular rhythm, normal heart sounds and intact distal pulses.   Pulmonary/Chest: Effort normal and breath sounds normal. No respiratory distress. He has no wheezes. He has no rales. He exhibits no tenderness.  Neurological: He is alert and oriented to person, place, and time.  Skin: Skin is warm and dry.  No rash noted of extremities, torso, face or neck. Oral  cavity examined without lesions or abnormality. There is erythema and scaling of inguinal folds bilaterally. No genital involvement. Seems consistent with yeast.  Psychiatric: Affect normal.  Vitals reviewed.  Assessment/Plan: 1. Urinary frequency Urine dip unremarkable. Will check CMP and A1C today to further assess.  - POCT Urinalysis Dipstick - Comp Met (CMET) - Hemoglobin A1c  2. Dry skin No rash of extremities, only itchy skin. Will check lab assessment to include liver function and A1C. Discussed in-shower moisturizers, gentle soaps and Moisturizing lotion. - Comp Met (CMET) - Hemoglobin A1c - TSH  3. Tinea cruris Rx lotrisone with strict use guidelines given. Hygiene measures reviewed. May need oral medication if symptoms are not  quickly responding to topical therapy.    Leeanne Rio, PA-C

## 2016-04-22 ENCOUNTER — Encounter: Payer: Self-pay | Admitting: Family Medicine

## 2016-06-29 ENCOUNTER — Ambulatory Visit (INDEPENDENT_AMBULATORY_CARE_PROVIDER_SITE_OTHER): Payer: BLUE CROSS/BLUE SHIELD | Admitting: Family Medicine

## 2016-06-29 ENCOUNTER — Encounter: Payer: Self-pay | Admitting: Family Medicine

## 2016-06-29 VITALS — BP 135/89 | HR 89 | Temp 98.0°F | Resp 17 | Ht 70.0 in | Wt 231.5 lb

## 2016-06-29 DIAGNOSIS — Z0000 Encounter for general adult medical examination without abnormal findings: Secondary | ICD-10-CM | POA: Diagnosis not present

## 2016-06-29 LAB — CBC WITH DIFFERENTIAL/PLATELET
Basophils Absolute: 0 cells/uL (ref 0–200)
Basophils Relative: 0 %
Eosinophils Absolute: 267 cells/uL (ref 15–500)
Eosinophils Relative: 3 %
HCT: 45.3 % (ref 38.5–50.0)
HEMOGLOBIN: 14.8 g/dL (ref 13.2–17.1)
LYMPHS ABS: 3115 {cells}/uL (ref 850–3900)
Lymphocytes Relative: 35 %
MCH: 26.7 pg — ABNORMAL LOW (ref 27.0–33.0)
MCHC: 32.7 g/dL (ref 32.0–36.0)
MCV: 81.8 fL (ref 80.0–100.0)
MONOS PCT: 8 %
MPV: 11.1 fL (ref 7.5–12.5)
Monocytes Absolute: 712 cells/uL (ref 200–950)
NEUTROS ABS: 4806 {cells}/uL (ref 1500–7800)
Neutrophils Relative %: 54 %
PLATELETS: 231 10*3/uL (ref 140–400)
RBC: 5.54 MIL/uL (ref 4.20–5.80)
RDW: 14.2 % (ref 11.0–15.0)
WBC: 8.9 10*3/uL (ref 3.8–10.8)

## 2016-06-29 LAB — BASIC METABOLIC PANEL
BUN: 10 mg/dL (ref 7–25)
CALCIUM: 8.6 mg/dL (ref 8.6–10.3)
CO2: 25 mmol/L (ref 20–31)
CREATININE: 0.83 mg/dL (ref 0.70–1.33)
Chloride: 108 mmol/L (ref 98–110)
Glucose, Bld: 86 mg/dL (ref 65–99)
POTASSIUM: 4.3 mmol/L (ref 3.5–5.3)
Sodium: 141 mmol/L (ref 135–146)

## 2016-06-29 LAB — LIPID PANEL
CHOL/HDL RATIO: 4.4 ratio (ref <5.0)
CHOLESTEROL: 127 mg/dL (ref <200)
HDL: 29 mg/dL — AB (ref >40)
LDL Cholesterol: 75 mg/dL (ref <100)
Triglycerides: 114 mg/dL (ref <150)
VLDL: 23 mg/dL (ref <30)

## 2016-06-29 LAB — HEPATIC FUNCTION PANEL
ALT: 35 U/L (ref 9–46)
AST: 22 U/L (ref 10–35)
Albumin: 3.8 g/dL (ref 3.6–5.1)
Alkaline Phosphatase: 68 U/L (ref 40–115)
BILIRUBIN TOTAL: 1.1 mg/dL (ref 0.2–1.2)
Bilirubin, Direct: 0.2 mg/dL (ref <=0.2)
Indirect Bilirubin: 0.9 mg/dL (ref 0.2–1.2)
Total Protein: 5.6 g/dL — ABNORMAL LOW (ref 6.1–8.1)

## 2016-06-29 LAB — TSH: TSH: 1.03 m[IU]/L (ref 0.40–4.50)

## 2016-06-29 NOTE — Patient Instructions (Signed)
Follow up in 6 months to recheck BP and make sure we can stay off meds We'll notify you of your lab results and make any changes if needed Continue to work on healthy diet and regular exercise- you can do it! Call with any questions or concerns Hang in there!!!

## 2016-06-29 NOTE — Progress Notes (Signed)
   Subjective:    Patient ID: [name]., male    DOB: 03-22-1966, 51 y.o.   MRN: [id number]  HPI CPE- UTD on cologuard, Tdap.  Pt stopped BP meds 5 days ago as he is trying to wean off.  Has lost 10 lbs.   Review of Systems Patient reports no vision/hearing changes, anorexia, fever ,adenopathy, persistant/recurrent hoarseness, swallowing issues, chest pain, palpitations, edema, persistant/recurrent cough, hemoptysis, dyspnea (rest,exertional, paroxysmal nocturnal), gastrointestinal  bleeding (melena, rectal bleeding), abdominal pain, excessive heart burn, GU symptoms (dysuria, hematuria, voiding/incontinence issues) syncope, focal weakness, memory loss, numbness & tingling, skin/hair/nail changes, depression, anxiety, abnormal bruising/bleeding, musculoskeletal symptoms/signs.     Objective:   Physical Exam General Appearance:    Alert, cooperative, no distress, appears stated age  Head:    Normocephalic, without obvious abnormality, atraumatic  Eyes:    PERRL, conjunctiva/corneas clear, EOM's intact, fundi    benign, both eyes       Ears:    Normal TM's and external ear canals, both ears  Nose:   Nares normal, septum midline, mucosa normal, no drainage   or sinus tenderness  Throat:   Lips, mucosa, and tongue normal; teeth and gums normal  Neck:   Supple, symmetrical, trachea midline, no adenopathy;       thyroid:  No enlargement/tenderness/nodules  Back:     Symmetric, no curvature, ROM normal, no CVA tenderness  Lungs:     Clear to auscultation bilaterally, respirations unlabored  Chest wall:    No tenderness or deformity  Heart:    Regular rate and rhythm, S1 and S2 normal, no murmur, rub   or gallop  Abdomen:     Soft, non-tender, bowel sounds active all four quadrants,    no masses, no organomegaly  Genitalia:    Deferred to urology  Rectal:    Extremities:   Extremities normal, atraumatic, no cyanosis or edema  Pulses:   2+ and symmetric all extremities  Skin:   Skin  color, texture, turgor normal, no rashes or lesions  Lymph nodes:   Cervical, supraclavicular, and axillary nodes normal  Neurologic:   CNII-XII intact. Normal strength, sensation and reflexes      throughout          Assessment & Plan:

## 2016-06-29 NOTE — Assessment & Plan Note (Signed)
Pt's PE WNL w/ exception of obesity.  UTD on cologuard, Tdap.  Check labs.  Anticipatory guidance provided.

## 2016-06-29 NOTE — Progress Notes (Signed)
Pre visit review using our clinic review tool, if applicable. No additional management support is needed unless otherwise documented below in the visit note. 

## 2016-06-30 LAB — PSA: PSA: 0.5 ng/mL (ref <=4.0)

## 2016-07-14 ENCOUNTER — Ambulatory Visit (INDEPENDENT_AMBULATORY_CARE_PROVIDER_SITE_OTHER): Payer: BLUE CROSS/BLUE SHIELD | Admitting: Family Medicine

## 2016-07-14 ENCOUNTER — Encounter: Payer: Self-pay | Admitting: Family Medicine

## 2016-07-14 VITALS — BP 123/82 | HR 79 | Resp 16 | Ht 70.0 in | Wt 232.4 lb

## 2016-07-14 DIAGNOSIS — M799 Soft tissue disorder, unspecified: Secondary | ICD-10-CM | POA: Diagnosis not present

## 2016-07-14 DIAGNOSIS — M7989 Other specified soft tissue disorders: Secondary | ICD-10-CM

## 2016-07-14 NOTE — Patient Instructions (Signed)
Follow up as scheduled/needed We'll notify you of your ultrasound results and your surgery appt Call with any questions or concerns Hang in there!!!

## 2016-07-14 NOTE — Progress Notes (Signed)
   Subjective:    Patient ID: George Needle., male    DOB: November 10, 1965, 51 y.o.   MRN: [id number]  HPI R rib pain- pt reports there's a lump on his R ribs.  Has been present for 'a while'.  When pt is fatigued, tense, or driving will develop pain.     Review of Systems For ROS see HPI     Objective:   Physical Exam  Constitutional: He is oriented to person, place, and time. He appears well-developed and well-nourished. No distress.  HENT:  Head: Normocephalic and atraumatic.  Pulmonary/Chest: He exhibits tenderness (mild TTP over 2 cm mobile soft tissue mass in mid-axillary line over R lower rib).  Neurological: He is alert and oriented to person, place, and time.  Skin: Skin is warm and dry.  Psychiatric: He has a normal mood and affect. His behavior is normal. Thought content normal.  Vitals reviewed.         Assessment & Plan:  Soft tissue mass- most likely a lipoma but since pt is now having discomfort w/ certain positions, will get [place] to assess.  Pt is also interested in discussion regarding removal- will refer to [facility].  Pt expressed understanding and is in agreement w/ plan.

## 2016-07-14 NOTE — Progress Notes (Signed)
Pre visit review using our clinic review tool, if applicable. No additional management support is needed unless otherwise documented below in the visit note. 

## 2016-07-17 ENCOUNTER — Encounter: Payer: Self-pay | Admitting: Family Medicine

## 2016-07-23 ENCOUNTER — Ambulatory Visit
Admission: RE | Admit: 2016-07-23 | Discharge: 2016-07-23 | Disposition: A | Discharge status: Home / Self Care | Payer: BLUE CROSS/BLUE SHIELD | Source: Ambulatory Visit | Attending: Family Medicine | Admitting: Family Medicine

## 2016-07-23 DIAGNOSIS — M7989 Other specified soft tissue disorders: Secondary | ICD-10-CM

## 2016-08-06 ENCOUNTER — Other Ambulatory Visit: Payer: Self-pay | Admitting: Family Medicine

## 2016-10-29 ENCOUNTER — Ambulatory Visit (INDEPENDENT_AMBULATORY_CARE_PROVIDER_SITE_OTHER): Payer: BLUE CROSS/BLUE SHIELD | Admitting: Family Medicine

## 2016-10-29 ENCOUNTER — Encounter: Payer: Self-pay | Admitting: Family Medicine

## 2016-10-29 VITALS — BP 125/80 | HR 75 | Temp 98.1°F | Resp 17 | Ht 70.0 in | Wt 241.2 lb

## 2016-10-29 DIAGNOSIS — M109 Gout, unspecified: Secondary | ICD-10-CM | POA: Diagnosis not present

## 2016-10-29 LAB — URIC ACID: Uric Acid, Serum: 6.6 mg/dL (ref 4.0–7.8)

## 2016-10-29 MED ORDER — INDOMETHACIN 50 MG PO CAPS: 50.0000 mg | ORAL_CAPSULE | Freq: Three times a day (TID) | ORAL | 0 refills | Status: DC

## 2016-10-29 NOTE — Patient Instructions (Signed)
Follow up as needed/scheduled Start the Indomethacin 3x/day (w/ food!) for gout pain ICE! No additional anti-inflammatories (ibuprofen, aleve, motrin, etc) but you can add tylenol for pain Call with any questions or concerns Hang in there!!!

## 2016-10-29 NOTE — Progress Notes (Signed)
Pre visit review using our clinic review tool, if applicable. No additional management support is needed unless otherwise documented below in the visit note. 

## 2016-10-29 NOTE — Assessment & Plan Note (Signed)
Deteriorated.  Pt is having an acute gout flare w/ no improvement after 2 days of colchicine.  Last flare was 2015 and indomethacin was effective at that time.  Check uric acid level to determine if daily Allopurinol is needed.  Reviewed supportive care and red flags that should prompt return.  Pt expressed understanding and is in agreement w/ plan.

## 2016-10-29 NOTE — Progress Notes (Signed)
   Subjective:    Patient ID: George Needle., male    DOB: 1965-10-13, 51 y.o.   MRN: [id number]  HPI Gout- pt was seen yesterday at [facility]' and dx'd w/ gout and started on colchicine.  Pain started 2 days ago.  Has redness and swelling of L foot.  Reports yesterday the whole L leg was swollen from his knee down.   Review of Systems For ROS see HPI     Objective:   Physical Exam  Constitutional: He is oriented to person, place, and time. He appears well-developed and well-nourished. No distress.  Cardiovascular: Intact distal pulses.   Musculoskeletal: He exhibits edema (swelling of L great toe, MTP joint, and 1st metatarsal w/ mild swelling to just above ankle).  Neurological: He is alert and oriented to person, place, and time.  Skin: Skin is warm and dry. There is erythema (erythema of L great toe and 1st metatarsal).  Psychiatric: He has a normal mood and affect. His behavior is normal.  Vitals reviewed.         Assessment & Plan:

## 2016-12-04 ENCOUNTER — Ambulatory Visit (INDEPENDENT_AMBULATORY_CARE_PROVIDER_SITE_OTHER): Payer: BLUE CROSS/BLUE SHIELD | Admitting: Physician Assistant

## 2016-12-04 ENCOUNTER — Encounter: Payer: Self-pay | Admitting: Physician Assistant

## 2016-12-04 VITALS — BP 128/80 | HR 83 | Temp 98.4°F | Resp 14 | Ht 70.0 in | Wt 243.0 lb

## 2016-12-04 DIAGNOSIS — M109 Gout, unspecified: Secondary | ICD-10-CM

## 2016-12-04 MED ORDER — INDOMETHACIN 50 MG PO CAPS: 50.0000 mg | ORAL_CAPSULE | Freq: Three times a day (TID) | ORAL | 0 refills | Status: DC

## 2016-12-04 NOTE — Patient Instructions (Signed)
Please take the Indomethacin as directed. Please follow recommendations below.  I want you to follow-up in 2 weeks for lab so we can recheck uric acid level and some other markers of inflammation.  Please call or return to clinic if symptoms are not resolving.    Gout Gout is painful swelling that can happen in some of your joints. Gout is a type of arthritis. This condition is caused by having too much uric acid in your body. Uric acid is a chemical that is made when your body breaks down substances called purines. If your body has too much uric acid, sharp crystals can form and build up in your joints. This causes pain and swelling. Gout attacks can happen quickly and be very painful (acute gout). Over time, the attacks can affect more joints and happen more often (chronic gout). Follow these instructions at home: During a Gout Attack  If directed, put ice on the painful area: ? Put ice in a plastic bag. ? Place a towel between your skin and the bag. ? Leave the ice on for 20 minutes, 2-3 times a day.  Rest the joint as much as possible. If the joint is in your leg, you may be given crutches to use.  Raise (elevate) the painful joint above the level of your heart as often as you can.  Drink enough fluids to keep your pee (urine) clear or pale yellow.  Take over-the-counter and prescription medicines only as told by your doctor.  Do not drive or use heavy machinery while taking prescription pain medicine.  Follow instructions from your doctor about what you can or cannot eat and drink.  Return to your normal activities as told by your doctor. Ask your doctor what activities are safe for you. Avoiding Future Gout Attacks  Follow a low-purine diet as told by a specialist (dietitian) or your doctor. Avoid foods and drinks that have a lot of purines, such as: ? Liver. ? Kidney. ? Anchovies. ? Asparagus. ? Herring. ? Mushrooms ? Mussels. ? Beer.  Limit alcohol intake to no  more than 1 drink a day for nonpregnant women and 2 drinks a day for men. One drink equals 12 oz of beer, 5 oz of wine, or 1 oz of hard liquor.  Stay at a healthy weight or lose weight if you are overweight. If you want to lose weight, talk with your doctor. It is important that you do not lose weight too fast.  Start or continue an exercise plan as told by your doctor.  Drink enough fluids to keep your pee clear or pale yellow.  Take over-the-counter and prescription medicines only as told by your doctor.  Keep all follow-up visits as told by your doctor. This is important. Contact a doctor if:  You have another gout attack.  You still have symptoms of a gout attack after10 days of treatment.  You have problems (side effects) because of your medicines.  You have chills or a fever.  You have burning pain when you pee (urinate).  You have pain in your lower back or belly. Get help right away if:  You have very bad pain.  Your pain cannot be controlled.  You cannot pee. This information is not intended to replace advice given to you by your health care provider. Make sure you discuss any questions you have with your health care provider. Document Released: 12/17/2007 Document Revised: 08/15/2015 Document Reviewed: 12/20/2014 Elsevier Interactive Patient Education  [employer].

## 2016-12-04 NOTE — Progress Notes (Signed)
Patient presents to clinic today c/o gout flare of R great toe starting yesterday. Has noted pain, redness and swelling. Last flare 1 month prior, alleviated with indomethacin. Uric acid level drawn at that time was in normal range but patient was advised this could be misleading. Patient does drink alcohol but denies recent consumption. Has been eating red meats and heavy tomato-based dishes. Is wondering if he could have arthritis in R elbow and knee secondary to the gout.    Past Medical History:  Diagnosis Date  . Asthma   . Gout   . H/O vasectomy   . Hypertension     Current Outpatient Prescriptions on File Prior to Visit  Medication Sig Dispense Refill  . albuterol (PROAIR HFA) 108 (90 Base) MCG/ACT inhaler INHALE TWO   PUFFS INTO THE LUNGS EVERY SIX HOURS   AS NEEDED   FOR WHEEZING 2 Inhaler 6  . budesonide-formoterol (SYMBICORT) 80-4.5 MCG/ACT inhaler INHALE TWO   PUFFS INTO THE LUNGS TWO   (TWO) TIMES DAILY. 2 Inhaler 3  . clotrimazole-betamethasone (LOTRISONE) cream Apply 1 application topically 2 (two) times daily. For 2 weeks max 30 g 0  . loratadine (ALLERGY RELIEF) 10 MG tablet Take 10 mg by mouth daily.    . sildenafil (REVATIO) 20 MG tablet THREE TABS DAILY AS NEEDED FOR ED 60 tablet 3  . colchicine 0.6 MG tablet   0   No current facility-administered medications on file prior to visit.     No Known Allergies  Family History  Problem Relation Age of Onset  . Hypertension Mother   . Diabetes Mother   . Hypertension Father   . Prostate cancer Paternal Uncle     Social History   Social History  . Marital status: Married    Spouse name: N/A  . Number of children: N/A  . Years of education: N/A   Occupational History  . printing     Social History Main Topics  . Smoking status: Never Smoker  . Smokeless tobacco: Never Used  . Alcohol use 0.0 oz/week     Comment: occ.  . Drug use: No  . Sexual activity: Not Asked   Other Topics Concern  . None    Social History Narrative  . None   Review of Systems - See HPI.  All other ROS are negative.  BP 128/80   Pulse 83   Temp 98.4 F (36.9 C) (Oral)   Resp 14   Ht 5\' 10"  (1.778 m)   Wt 243 lb (110.2 kg)   SpO2 96%   BMI 34.87 kg/m   Physical Exam  Constitutional: He is oriented to person, place, and time and well-developed, well-nourished, and in no distress.  HENT:  Head: Normocephalic and atraumatic.  Eyes: Conjunctivae are normal.  Neck: Neck supple.  Cardiovascular: Normal rate, regular rhythm, normal heart sounds and intact distal pulses.   Pulmonary/Chest: Effort normal and breath sounds normal. No respiratory distress. He has no wheezes. He has no rales. He exhibits no tenderness.  Musculoskeletal:       Feet:  Neurological: He is alert and oriented to person, place, and time.  Skin: Skin is warm and dry. No rash noted.  Psychiatric: Affect normal.  Vitals reviewed.   Recent Results (from the past 2160 hour(s))  Uric acid     Status: None   Collection Time: 10/29/16  2:39 PM  Result Value Ref Range   Uric Acid, Serum 6.6 4.0 - 7.8 mg/dL  Assessment/Plan: 1. Acute gout involving toe of right foot, unspecified cause Start indomethacin. Supportive measures reviewed. Patient to make dietary changes. Will have him return in 2 weeks after resolution for repeat Uric acid levels.  Leeanne Rio, PA-C

## 2016-12-04 NOTE — Progress Notes (Signed)
Pre visit review using our clinic review tool, if applicable. No additional management support is needed unless otherwise documented below in the visit note. 

## 2016-12-09 ENCOUNTER — Encounter: Payer: Self-pay | Admitting: Family Medicine

## 2016-12-09 MED ORDER — PREDNISONE 20 MG PO TABS: 40.0000 mg | ORAL_TABLET | Freq: Every day | ORAL | 0 refills | Status: DC

## 2016-12-09 NOTE — Addendum Note (Signed)
Addended by: Brunetta Jeans on: 12/09/2016 03:50 PM   Modules accepted: Orders

## 2016-12-18 ENCOUNTER — Ambulatory Visit (INDEPENDENT_AMBULATORY_CARE_PROVIDER_SITE_OTHER): Payer: BLUE CROSS/BLUE SHIELD | Admitting: Family Medicine

## 2016-12-18 ENCOUNTER — Encounter: Payer: Self-pay | Admitting: Family Medicine

## 2016-12-18 VITALS — BP 112/82 | HR 74 | Resp 16 | Ht 70.0 in | Wt 238.2 lb

## 2016-12-18 DIAGNOSIS — M109 Gout, unspecified: Secondary | ICD-10-CM

## 2016-12-18 DIAGNOSIS — Z23 Encounter for immunization: Secondary | ICD-10-CM | POA: Diagnosis not present

## 2016-12-18 NOTE — Progress Notes (Signed)
Pre visit review using our clinic review tool, if applicable. No additional management support is needed unless otherwise documented below in the visit note. 

## 2016-12-18 NOTE — Progress Notes (Signed)
   Subjective:    Patient ID: George Needle., male    DOB: [birth year], 51 y.o.   MRN: [id number]  HPI Gout f/u- pt had his 2nd acute attack in 5 weeks earlier this month and was told to return for repeat Uric Acid testing.  Completed his course of Prednisone but continues to have R great toe pain.  He doesn't feel that this is gout related, feels it's his choice of footwear.  Pt reports he saw Ortho (Dr Theda Sers) on Wednesday and they drew a number of labs, looking for possible autoimmune or other inflammatory markers.  Pt reports he is having soreness of elbows and knees bilaterally.   Review of Systems For ROS see HPI     Objective:   Physical Exam  Constitutional: He is oriented to person, place, and time. He appears well-developed and well-nourished. No distress.  HENT:  Head: Normocephalic and atraumatic.  Cardiovascular: Intact distal pulses.   Musculoskeletal: He exhibits no edema or deformity.  No evidence of gout on PE today- no redness, warmth, or swelling of joints Mild TTP over IP joint of R great toe and mild TTP over R lateral epicondyle  Neurological: He is alert and oriented to person, place, and time. He has normal reflexes.  Skin: Skin is warm and dry. No rash noted. No erythema.  Psychiatric: He has a normal mood and affect. His behavior is normal. Thought content normal.  Vitals reviewed.         Assessment & Plan:

## 2016-12-18 NOTE — Assessment & Plan Note (Signed)
Chronic problem.  Pt admits that he has not been following a low purine diet.  No active gout at this time.  Will hold on repeating uric acid level as pt reports Ortho did a number of labs on Wednesday (results pending).  Will review these once available and determine next steps.  Will continue to follow.

## 2016-12-18 NOTE — Patient Instructions (Addendum)
Follow up as scheduled or as needed Continue to be as active as possible- this help with joint pain We'll review the labs from Ortho and determine if there is anything we need to do Call with any questions or concerns Happy Fall!!!!

## 2016-12-21 ENCOUNTER — Encounter: Payer: Self-pay | Admitting: Family Medicine

## 2016-12-28 ENCOUNTER — Ambulatory Visit: Payer: BLUE CROSS/BLUE SHIELD | Admitting: Family Medicine

## 2016-12-28 ENCOUNTER — Encounter: Payer: Self-pay | Admitting: Physician Assistant

## 2016-12-28 ENCOUNTER — Ambulatory Visit (INDEPENDENT_AMBULATORY_CARE_PROVIDER_SITE_OTHER): Payer: BLUE CROSS/BLUE SHIELD | Admitting: Physician Assistant

## 2016-12-28 VITALS — BP 128/86 | HR 86 | Temp 98.0°F | Resp 14 | Ht 70.0 in | Wt 238.0 lb

## 2016-12-28 DIAGNOSIS — I10 Essential (primary) hypertension: Secondary | ICD-10-CM

## 2016-12-28 DIAGNOSIS — M130 Polyarthritis, unspecified: Secondary | ICD-10-CM

## 2016-12-28 DIAGNOSIS — M2550 Pain in unspecified joint: Secondary | ICD-10-CM

## 2016-12-28 HISTORY — DX: Polyarthritis, unspecified: M13.0

## 2016-12-28 NOTE — Progress Notes (Signed)
Pre visit review using our clinic review tool, if applicable. No additional management support is needed unless otherwise documented below in the visit note. 

## 2016-12-28 NOTE — Assessment & Plan Note (Signed)
Referral to Rheumatology placed for further assessment giving elevated CRP and symptoms. RF, ESR and Anti-CCP negative.

## 2016-12-28 NOTE — Patient Instructions (Signed)
Please keep your phone on.  You will be contacted by Rheumatology for further assessment.  Your BP looks good. WE will stay off of medication. Follow-up in 3 months with Dr. Birdie Riddle.

## 2016-12-28 NOTE — Assessment & Plan Note (Signed)
BP stable off of medication. Will remain off. Continue DASH diet. Follow-up 3 months for recheck with PCP.

## 2016-12-28 NOTE — Progress Notes (Signed)
Patient presents to clinic today for follow-up of hypertension after discontinuation of losartan. Patient is not checking BP at home. States he feels well. Patient denies chest pain, palpitations, lightheadedness, dizziness, vision changes or frequent headaches.  BP Readings from Last 3 Encounters:  12/28/16 128/86  12/18/16 112/82  12/04/16 128/80   Patient also wanting to review recent results obtained by Orthopedist regarding chronic joint pain.  Past Medical History:  Diagnosis Date  . Asthma   . Gout   . H/O vasectomy   . Hypertension     Current Outpatient Prescriptions on File Prior to Visit  Medication Sig Dispense Refill  . albuterol (PROAIR HFA) 108 (90 Base) MCG/ACT inhaler INHALE TWO   PUFFS INTO THE LUNGS EVERY SIX HOURS   AS NEEDED   FOR WHEEZING 2 Inhaler 6  . budesonide-formoterol (SYMBICORT) 80-4.5 MCG/ACT inhaler INHALE TWO   PUFFS INTO THE LUNGS TWO   (TWO) TIMES DAILY. 2 Inhaler 3  . clotrimazole-betamethasone (LOTRISONE) cream Apply 1 application topically 2 (two) times daily. For 2 weeks max 30 g 0  . colchicine 0.6 MG tablet   0  . loratadine (ALLERGY RELIEF) 10 MG tablet Take 10 mg by mouth daily.    . sildenafil (REVATIO) 20 MG tablet THREE TABS DAILY AS NEEDED FOR ED 60 tablet 3   No current facility-administered medications on file prior to visit.     No Known Allergies  Family History  Problem Relation Age of Onset  . Hypertension Mother   . Diabetes Mother   . Hypertension Father   . Prostate cancer Paternal Uncle     Social History   Social History  . Marital status: Married    Spouse name: N/A  . Number of children: N/A  . Years of education: N/A   Occupational History  . printing     Social History Main Topics  . Smoking status: Never Smoker  . Smokeless tobacco: Never Used  . Alcohol use 0.0 oz/week     Comment: occ.  . Drug use: No  . Sexual activity: Not Asked   Other Topics Concern  . None   Social History Narrative    . None   Review of Systems - See HPI.  All other ROS are negative.  BP 128/86   Pulse 86   Temp 98 F (36.7 C) (Oral)   Resp 14   Ht '5\' 10"'  (1.778 m)   Wt 238 lb (108 kg)   SpO2 97%   BMI 34.15 kg/m   Physical Exam  Constitutional: He is oriented to person, place, and time and well-developed, well-nourished, and in no distress.  HENT:  Head: Normocephalic and atraumatic.  Eyes: Conjunctivae are normal.  Neck: Neck supple.  Cardiovascular: Normal rate, regular rhythm, normal heart sounds and intact distal pulses.   Pulmonary/Chest: Effort normal and breath sounds normal. No respiratory distress. He has no wheezes. He has no rales. He exhibits no tenderness.  Neurological: He is alert and oriented to person, place, and time.  Skin: Skin is warm and dry. No rash noted.  Psychiatric: Affect normal.  Vitals reviewed.  Recent Results (from the past 2160 hour(s))  Uric acid     Status: None   Collection Time: 10/29/16  2:39 PM  Result Value Ref Range   Uric Acid, Serum 6.6 4.0 - 7.8 mg/dL    Assessment/Plan: HTN (hypertension), benign BP stable off of medication. Will remain off. Continue DASH diet. Follow-up 3 months for recheck with PCP.  Polyarthritis Referral to Rheumatology placed for further assessment giving elevated CRP and symptoms. RF, ESR and Anti-CCP negative.     Leeanne Rio, PA-C

## 2017-01-11 ENCOUNTER — Encounter: Payer: Self-pay | Admitting: Family Medicine

## 2017-01-11 ENCOUNTER — Ambulatory Visit (INDEPENDENT_AMBULATORY_CARE_PROVIDER_SITE_OTHER): Payer: BLUE CROSS/BLUE SHIELD | Admitting: Family Medicine

## 2017-01-11 VITALS — BP 124/85 | HR 75 | Temp 98.2°F | Resp 16 | Ht 70.0 in | Wt 242.2 lb

## 2017-01-11 DIAGNOSIS — K1379 Other lesions of oral mucosa: Secondary | ICD-10-CM

## 2017-01-11 DIAGNOSIS — M130 Polyarthritis, unspecified: Secondary | ICD-10-CM | POA: Diagnosis not present

## 2017-01-11 MED ORDER — MELOXICAM 15 MG PO TABS: 15.0000 mg | ORAL_TABLET | Freq: Every day | ORAL | 1 refills | Status: DC

## 2017-01-11 NOTE — Progress Notes (Signed)
   Subjective:    Patient ID: [name]., male    DOB: 31-May-1965, 51 y.o.   MRN: [id number]  HPI Joint pain/mouth sores- pt has referral to Rheum pending.  Had elevated CRP at ortho (15.9), normal RF, normal WBC.  No ANA.  Pt reports knee pain is better, elbows have improved somewhat.  States rash has resolved but has mouth sore.  Using salt water rinses.  + chills, no documented fevers.    Review of Systems For ROS see HPI     Objective:   Physical Exam  Constitutional: He is oriented to person, place, and time. He appears well-developed and well-nourished. No distress.  HENT:  Head: Normocephalic and atraumatic.  1 cm aphthous ulcer on gum at lower lip frenulum  Musculoskeletal:  R elbow pain, no pain on L  Neurological: He is alert and oriented to person, place, and time.  Skin: Skin is warm and dry. No rash noted. No erythema.  Psychiatric: He has a normal mood and affect. His behavior is normal. Thought content normal.  Vitals reviewed.         Assessment & Plan:  Polyarthritis w/ mucocutaneous lesions- pt has hx of polyarthralgia and has noted skin rashes and now w/ a mouth sore.  This is concerning for an autoimmune/rheumatologic process.  Referral was placed on 10/8 but pt has not heard about an appt.  Will get ANA to determine if appt is needed.  Will start once daily Mobic for pain and inflammation.  Pt expressed understanding and is in agreement w/ plan.

## 2017-01-11 NOTE — Patient Instructions (Signed)
Follow up as needed or as scheduled We'll notify you of your lab results and make any changes if needed Start the once daily Meloxicam- take w/ food- for pain and inflammation Avoid additional ibuprofen, aleve, excedrin, motrin, etc.  Ok to add Tylenol as needed for breakthrough pain Call with any questions or concerns Happy Fall!!!

## 2017-01-12 LAB — ANA: ANA: NEGATIVE

## 2017-02-16 ENCOUNTER — Encounter: Payer: Self-pay | Admitting: Family Medicine

## 2017-02-17 NOTE — Telephone Encounter (Signed)
Please advise on the sildenafil, I placed the 20mg  for refill but pt is requesting 100mg ?

## 2017-02-17 NOTE — Telephone Encounter (Signed)
Ok to refill Mobic, can switch Sildenafil to 100mg  1/2-1 tab daily as needed for ED.  #10, 3 refills

## 2017-02-18 MED ORDER — MELOXICAM 15 MG PO TABS: 15.0000 mg | ORAL_TABLET | Freq: Every day | ORAL | 1 refills | Status: DC

## 2017-02-18 MED ORDER — SILDENAFIL CITRATE 100 MG PO TABS: ORAL_TABLET | ORAL | 3 refills | Status: DC

## 2017-02-24 ENCOUNTER — Other Ambulatory Visit: Payer: Self-pay | Admitting: Family Medicine

## 2017-02-24 NOTE — Telephone Encounter (Signed)
Copied from [place]. Topic: Quick Communication - Rx Refill/Question >> Feb 24, 2017  3:18 PM Clack, Laban Emperor wrote: Has the patient contacted their pharmacy? No.   (Agent: If no, request that the patient contact the pharmacy for the refill.)   Preferred Pharmacy (with phone number or street name): [facility], [address] [phone] (Phone) [phone] (Fax)  Pt is requesting a med refill on his albuterol (PROAIR HFA) 108 (90 Base) MCG/ACT inhaler [[id number]] and budesonide-formoterol (SYMBICORT) 80-4.5 MCG/ACT inhaler [[id number]]   Agent: Please be advised that RX refills may take up to 3 business days. We ask that you follow-up with your pharmacy.

## 2017-02-26 ENCOUNTER — Other Ambulatory Visit: Payer: Self-pay | Admitting: *Deleted

## 2017-02-26 MED ORDER — BUDESONIDE-FORMOTEROL FUMARATE 80-4.5 MCG/ACT IN AERO: INHALATION_SPRAY | RESPIRATORY_TRACT | 1 refills | Status: DC

## 2017-02-26 MED ORDER — ALBUTEROL SULFATE HFA 108 (90 BASE) MCG/ACT IN AERS: INHALATION_SPRAY | RESPIRATORY_TRACT | 1 refills | Status: DC

## 2017-02-26 NOTE — Progress Notes (Signed)
Pt called and message left that medications had been refilled and to check with pharmacy to see when available for pick up.

## 2017-02-26 NOTE — Telephone Encounter (Signed)
Left message that medications had been refilled and to contact pharmacy to see when available for pick up.

## 2017-05-03 ENCOUNTER — Other Ambulatory Visit: Payer: Self-pay

## 2017-05-03 ENCOUNTER — Encounter: Payer: Self-pay | Admitting: Family Medicine

## 2017-05-03 ENCOUNTER — Ambulatory Visit: Payer: BLUE CROSS/BLUE SHIELD | Admitting: Family Medicine

## 2017-05-03 VITALS — BP 141/86 | HR 83 | Resp 16 | Ht 70.0 in | Wt 249.2 lb

## 2017-05-03 DIAGNOSIS — E669 Obesity, unspecified: Secondary | ICD-10-CM | POA: Diagnosis not present

## 2017-05-03 DIAGNOSIS — K219 Gastro-esophageal reflux disease without esophagitis: Secondary | ICD-10-CM

## 2017-05-03 DIAGNOSIS — I10 Essential (primary) hypertension: Secondary | ICD-10-CM | POA: Diagnosis not present

## 2017-05-03 LAB — HEPATIC FUNCTION PANEL
ALBUMIN: 4 g/dL (ref 3.5–5.2)
ALK PHOS: 77 U/L (ref 39–117)
ALT: 25 U/L (ref 0–53)
AST: 15 U/L (ref 0–37)
BILIRUBIN DIRECT: 0.2 mg/dL (ref 0.0–0.3)
BILIRUBIN TOTAL: 0.8 mg/dL (ref 0.2–1.2)
Total Protein: 5.8 g/dL — ABNORMAL LOW (ref 6.0–8.3)

## 2017-05-03 LAB — CBC WITH DIFFERENTIAL/PLATELET
BASOS ABS: 0 10*3/uL (ref 0.0–0.1)
BASOS PCT: 0.3 % (ref 0.0–3.0)
EOS PCT: 4.6 % (ref 0.0–5.0)
Eosinophils Absolute: 0.4 10*3/uL (ref 0.0–0.7)
HEMATOCRIT: 45.3 % (ref 39.0–52.0)
Hemoglobin: 15 g/dL (ref 13.0–17.0)
LYMPHS ABS: 3.1 10*3/uL (ref 0.7–4.0)
Lymphocytes Relative: 37 % (ref 12.0–46.0)
MCHC: 33.2 g/dL (ref 30.0–36.0)
MCV: 80.2 fl (ref 78.0–100.0)
MONOS PCT: 7.2 % (ref 3.0–12.0)
Monocytes Absolute: 0.6 10*3/uL (ref 0.1–1.0)
NEUTROS ABS: 4.3 10*3/uL (ref 1.4–7.7)
Neutrophils Relative %: 50.9 % (ref 43.0–77.0)
PLATELETS: 192 10*3/uL (ref 150.0–400.0)
RBC: 5.65 Mil/uL (ref 4.22–5.81)
RDW: 14.5 % (ref 11.5–15.5)
WBC: 8.4 10*3/uL (ref 4.0–10.5)

## 2017-05-03 LAB — LIPID PANEL
Cholesterol: 136 mg/dL (ref 0–200)
HDL: 28.3 mg/dL — ABNORMAL LOW (ref >39.00)
LDL CALC: 90 mg/dL (ref 0–99)
NONHDL: 107.9
Total CHOL/HDL Ratio: 5
Triglycerides: 91 mg/dL (ref 0.0–149.0)
VLDL: 18.2 mg/dL (ref 0.0–40.0)

## 2017-05-03 LAB — BASIC METABOLIC PANEL
BUN: 14 mg/dL (ref 6–23)
CALCIUM: 8.8 mg/dL (ref 8.4–10.5)
CO2: 28 mEq/L (ref 19–32)
Chloride: 107 mEq/L (ref 96–112)
Creatinine, Ser: 0.77 mg/dL (ref 0.40–1.50)
GFR: 112.7 mL/min (ref >60.00)
Glucose, Bld: 89 mg/dL (ref 70–99)
POTASSIUM: 4.5 meq/L (ref 3.5–5.1)
Sodium: 142 mEq/L (ref 135–145)

## 2017-05-03 LAB — TSH: TSH: 2.04 u[IU]/mL (ref 0.35–4.50)

## 2017-05-03 LAB — HEMOGLOBIN A1C: Hgb A1c MFr Bld: 5.7 % (ref 4.6–6.5)

## 2017-05-03 MED ORDER — HYDROCHLOROTHIAZIDE 12.5 MG PO TABS
12.5000 mg | ORAL_TABLET | Freq: Every day | ORAL | 3 refills | Status: DC
Start: 2017-05-03 — End: 2021-07-21

## 2017-05-03 MED ORDER — RANITIDINE HCL 300 MG PO TABS: 300.0000 mg | ORAL_TABLET | Freq: Every day | ORAL | 1 refills | Status: DC

## 2017-05-03 NOTE — Assessment & Plan Note (Signed)
Chronic problem.  Pt has been able to control his BP recently w/o medication but recently his readings have again been elevated.  Based on this will start HCTZ daily.  Reviewed lifestyle and dietary modifications that will improve BP.  Will follow.

## 2017-05-03 NOTE — Progress Notes (Signed)
   Subjective:    Patient ID: [name]., male    DOB: 12-24-1965, 52 y.o.   MRN: [id number]  HPI HTN- chronic problem.  Has been on medication in the past but recently was attempting to control w/ diet and exercise.  He has gained 7 lbs since last visit and BP is again elevated.  Pt reports home BPs have been elevated- 150s/90s.  + HA.  Intermittent CP but 'it's indigestion type pain' after eating.  + increased stress recently.   Review of Systems For ROS see HPI     Objective:   Physical Exam  Constitutional: He is oriented to person, place, and time. He appears well-developed and well-nourished. No distress.  HENT:  Head: Normocephalic and atraumatic.  Eyes: Conjunctivae and EOM are normal. Pupils are equal, round, and reactive to light.  Neck: Normal range of motion. Neck supple. No thyromegaly present.  Cardiovascular: Normal rate, regular rhythm, normal heart sounds and intact distal pulses.  No murmur heard. Pulmonary/Chest: Effort normal and breath sounds normal. No respiratory distress.  Abdominal: Soft. Bowel sounds are normal. He exhibits no distension.  Musculoskeletal: He exhibits no edema.  Lymphadenopathy:    He has no cervical adenopathy.  Neurological: He is alert and oriented to person, place, and time. No cranial nerve deficit.  Skin: Skin is warm and dry.  Psychiatric: He has a normal mood and affect. His behavior is normal.  Vitals reviewed.         Assessment & Plan:

## 2017-05-03 NOTE — Assessment & Plan Note (Signed)
Deteriorated.  Pt has gained 7 lbs since last visit and BMI is in obese range.  Check labs to risk stratify.  Stressed need for healthy diet and regular exercise.  Will follow.

## 2017-05-03 NOTE — Assessment & Plan Note (Signed)
Deteriorated w/ recent stress and weight gain.  Will start Ranitidine 300mg  nightly. Again reviewed lifestyle and dietary modifications.  Will follow.

## 2017-05-03 NOTE — Patient Instructions (Signed)
Follow up in 3-4 weeks to recheck blood pressure We'll notify you of your lab results and make any changes if needed START the HCTZ once daily for BP START the Ranitidine nightly for increased acid Drink plenty of water Try and make healthy food choices and get regular exercise Call with any questions or concerns Hang in there!!

## 2017-05-04 ENCOUNTER — Encounter: Payer: Self-pay | Admitting: General Practice

## 2017-05-05 ENCOUNTER — Encounter: Payer: Self-pay | Admitting: Family Medicine

## 2017-05-17 ENCOUNTER — Other Ambulatory Visit: Payer: Self-pay

## 2017-05-17 ENCOUNTER — Encounter: Payer: Self-pay | Admitting: Family Medicine

## 2017-05-17 ENCOUNTER — Ambulatory Visit: Payer: BLUE CROSS/BLUE SHIELD | Admitting: Family Medicine

## 2017-05-17 VITALS — BP 122/83 | HR 69 | Temp 98.1°F | Resp 16 | Ht 70.0 in | Wt 247.5 lb

## 2017-05-17 DIAGNOSIS — F419 Anxiety disorder, unspecified: Secondary | ICD-10-CM

## 2017-05-17 DIAGNOSIS — G4700 Insomnia, unspecified: Secondary | ICD-10-CM | POA: Diagnosis not present

## 2017-05-17 DIAGNOSIS — F32A Depression, unspecified: Secondary | ICD-10-CM

## 2017-05-17 DIAGNOSIS — F329 Major depressive disorder, single episode, unspecified: Secondary | ICD-10-CM | POA: Diagnosis not present

## 2017-05-17 DIAGNOSIS — K219 Gastro-esophageal reflux disease without esophagitis: Secondary | ICD-10-CM

## 2017-05-17 HISTORY — DX: Anxiety disorder, unspecified: F41.9

## 2017-05-17 MED ORDER — OMEPRAZOLE 40 MG PO CPDR
40.0000 mg | DELAYED_RELEASE_CAPSULE | Freq: Every day | ORAL | 3 refills | Status: DC
Start: 2017-05-17 — End: 2018-02-01

## 2017-05-17 MED ORDER — TRAZODONE HCL 50 MG PO TABS: 25.0000 mg | ORAL_TABLET | Freq: Every evening | ORAL | 3 refills | Status: DC | PRN

## 2017-05-17 NOTE — Assessment & Plan Note (Signed)
Deteriorated.  Pt works 3rd shift and is not sleeping more than 4-5 hrs at a time.  This is not helping his mood.  Start Trazodone.  Will follow.

## 2017-05-17 NOTE — Assessment & Plan Note (Signed)
New.  Pt is very flat and withdrawn.  Unclear whether this is exhaustion due to work or worsening anxiety/depression given his family and work situation.  Offered medication- pt declined.  Recommended counseling- pt not interested.  Will follow.

## 2017-05-17 NOTE — Progress Notes (Signed)
   Subjective:    Patient ID: Darci Needle., male    DOB: [birth year], 52 y.o.   MRN: [id number]  HPI Stomach issues- pt reports Ranitidine 300mg  is 'not working'.  sxs improved w/ 'a bottle of Pepto'.   Pt reports it doesn't matter what type of food he eats.  sxs are 'worse when I go to work'.  Denies nausea/vomiting.  Pt reports when he 'tries to cut back on my food to lose weight, the acid worsens'.  Anxiety/depression- pt reports that he has been really stressed x3 yrs since starting to work nights.  + weight gain, joint pains.  Decreased energy level, low motivation.  Sleep schedule is very disordered b/c of job.   Review of Systems For ROS see HPI     Objective:   Physical Exam  Constitutional: He is oriented to person, place, and time. He appears well-developed and well-nourished. No distress.  obese  HENT:  Head: Normocephalic and atraumatic.  Abdominal: Soft. He exhibits no distension. There is no tenderness. There is no rebound and no guarding.  Neurological: He is alert and oriented to person, place, and time.  Skin: Skin is warm and dry.  Psychiatric: His behavior is normal. Thought content normal.  Flat affect, withdrawn.  Vitals reviewed.         Assessment & Plan:

## 2017-05-17 NOTE — Assessment & Plan Note (Signed)
Deteriorated.  Pt found Ranitidine 300mg  to be 'worthless'.  Sxs worsen when he has to work vs days he is off.  Suspect there is a stress component.  Discussed SSRI- pt is opposed at this time.  Start PPI.  Reviewed dietary and lifestyle modifications.  Will follow.

## 2017-05-17 NOTE — Patient Instructions (Signed)
Follow up in 1 month to recheck mood and sleep STOP the Ranitidine START the Omeprazole 40mg  daily USE the Trazodone for sleep prior to bed Try and find a stress outlet!  You deserve it! Consider counseling- it's good to have someone to talk to!! We can always revisit the idea of medication if needed Call with any questions or concerns Hang in there!!!

## 2017-06-25 ENCOUNTER — Encounter: Payer: Self-pay | Admitting: Family Medicine

## 2017-06-25 ENCOUNTER — Ambulatory Visit: Payer: BLUE CROSS/BLUE SHIELD | Admitting: Family Medicine

## 2017-06-25 ENCOUNTER — Other Ambulatory Visit: Payer: Self-pay

## 2017-06-25 VITALS — BP 130/84 | HR 86 | Temp 98.0°F | Resp 16 | Ht 70.0 in | Wt 247.5 lb

## 2017-06-25 DIAGNOSIS — F419 Anxiety disorder, unspecified: Secondary | ICD-10-CM

## 2017-06-25 DIAGNOSIS — F329 Major depressive disorder, single episode, unspecified: Secondary | ICD-10-CM

## 2017-06-25 DIAGNOSIS — F32A Depression, unspecified: Secondary | ICD-10-CM

## 2017-06-25 MED ORDER — BUPROPION HCL 75 MG PO TABS: 75.0000 mg | ORAL_TABLET | Freq: Two times a day (BID) | ORAL | 1 refills | Status: DC

## 2017-06-25 NOTE — Progress Notes (Signed)
   Subjective:    Patient ID: George Needle., male    DOB: July 04, 1965, 52 y.o.   MRN: [id number]  HPI Mood/sleep- when asked if feeling any better, 'some days I do and some days I don't'.  Pt will have intermittent IBS sxs due to stress.  Pt is looking to change jobs- he is currently working 3rd shift.  He had 2 interviews but did not get either job.  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  'i'm just tired all the time.  Beat down'.  'I don't want to take any more medicine'.  Not taking Trazodone- 'it didn't work'.  'I didn't feel bad until I started this job- that was 3 yrs ago'.  Pt is starting softball again this weekend.   Review of Systems For ROS see HPI     Objective:   Physical Exam  Constitutional: He is oriented to person, place, and time. He appears well-developed and well-nourished. No distress.  obese  Neurological: He is alert and oriented to person, place, and time.  Psychiatric:  Flat affect Depressed  Vitals reviewed.         Assessment & Plan:

## 2017-06-25 NOTE — Patient Instructions (Signed)
Follow up in 4-6 weeks to recheck mood START the Bupropion twice daily for mood and energy Continue to work on stress management- exercise, art, etc Call with any questions or concerns Hang in there!!!

## 2017-06-25 NOTE — Assessment & Plan Note (Signed)
Deteriorated.  Pt is very flat, obviously depressed.  Feels this is all job related but has a lot of home stressors as well.  He is not able to work counseling into his schedule and despite his reluctance to take medication, I worked hard to convince him it is not necessary to feel this way.  Discussed taking medication until he was able to work out a new work schedule.  Will follow closely.

## 2017-07-02 ENCOUNTER — Other Ambulatory Visit: Payer: Self-pay | Admitting: General Practice

## 2017-07-02 NOTE — Telephone Encounter (Signed)
Called the pharmacy. She advised that the pt still has 3 refills remaining. They will fill it for pt.

## 2017-07-02 NOTE — Telephone Encounter (Signed)
These meds should both have refills remaining.  Please call pharmacy

## 2017-07-02 NOTE — Telephone Encounter (Signed)
Last OV 06/25/17 Trazodone last filled 04/27/17 #30 with 3 (listed as ineffective) Omeprazole last filled 05/17/17 #30 with 3

## 2017-07-19 ENCOUNTER — Other Ambulatory Visit: Payer: Self-pay | Admitting: Family Medicine

## 2017-07-19 ENCOUNTER — Other Ambulatory Visit: Payer: Self-pay

## 2017-07-19 ENCOUNTER — Encounter: Payer: Self-pay | Admitting: Family Medicine

## 2017-07-19 ENCOUNTER — Ambulatory Visit: Payer: BLUE CROSS/BLUE SHIELD | Admitting: Family Medicine

## 2017-07-19 VITALS — BP 129/82 | HR 78 | Temp 98.1°F | Resp 16 | Ht 70.0 in | Wt 246.2 lb

## 2017-07-19 DIAGNOSIS — F419 Anxiety disorder, unspecified: Secondary | ICD-10-CM | POA: Diagnosis not present

## 2017-07-19 DIAGNOSIS — F32A Depression, unspecified: Secondary | ICD-10-CM

## 2017-07-19 DIAGNOSIS — F329 Major depressive disorder, single episode, unspecified: Secondary | ICD-10-CM | POA: Diagnosis not present

## 2017-07-19 NOTE — Patient Instructions (Addendum)
Follow up as scheduled or as needed If you decide on FMLA, please submit your forms.  I am willing to write 4 weeks out of work at this time Please consider trying a medication to help w/ anxiety/depression Call with any questions or concerns Hang in there!!!

## 2017-07-19 NOTE — Progress Notes (Signed)
   Subjective:    Patient ID: George Needle., male    DOB: December 21, 1965, 52 y.o.   MRN: [id number]  HPI Anxiety/depression- pt was started on Wellbutrin on 4/5 but stopped medication after 2 weeks b/c 'I felt worse on it'.  Pt reports increased GERD, constipation.  No interest in trying another medication.  Pt reports he is now 'having little panic attacks'.  'stuff that didn't used to bother me, gets to me'.  Last 'panic' moment was on Sunday.  Occurring multiple times a week.  Will have palpitations, chest tightness, increased anxiety.  'I haven't slept well in 3 yrs'.  Pt continues to ask for time off, 'but I don't have it'.  Pt is resistant to meds but after leaving appt sent message indicating he was willing to try.   Review of Systems For ROS see HPI     Objective:   Physical Exam  Constitutional: He is oriented to person, place, and time. He appears well-developed and well-nourished. No distress.  obese  HENT:  Head: Normocephalic and atraumatic.  Neurological: He is alert and oriented to person, place, and time.  Psychiatric: His behavior is normal. Judgment and thought content normal.  Flat affect, withdrawn, rambling speech- needs redirection at times  Vitals reviewed.         Assessment & Plan:

## 2017-07-21 ENCOUNTER — Telehealth: Payer: Self-pay | Admitting: Family Medicine

## 2017-07-21 DIAGNOSIS — Z0279 Encounter for issue of other medical certificate: Secondary | ICD-10-CM

## 2017-07-21 MED ORDER — CITALOPRAM HYDROBROMIDE 10 MG PO TABS: 10.0000 mg | ORAL_TABLET | Freq: Every day | ORAL | 3 refills | Status: DC

## 2017-07-21 NOTE — Telephone Encounter (Signed)
Fyi.

## 2017-07-21 NOTE — Telephone Encounter (Signed)
Received FMLA paperwork for pt, pt states that [employer] has taking him out of work and is aware of what forms need to be filled out for. Pt ask that he gets a CB when forms are completed and ask that they be faxed to number on forms. Placed in bin upfront with charge sheet.

## 2017-07-21 NOTE — Telephone Encounter (Signed)
Made sure pt received CB as stated in message that paperwork has been completed and faxed.

## 2017-07-21 NOTE — Telephone Encounter (Signed)
Forms completed and placed in basket 

## 2017-07-21 NOTE — Assessment & Plan Note (Signed)
Deteriorated.  Pt is really struggling.  He is not sleeping, he is not eating well or exercising.  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  He is attempting to switch to day shift for better hours and better sleep patterns.  Discussed at length that he doesn't have available vacation time but that he could use FMLA for 4 weeks to regroup.  Pt to consider this and send forms if he decides.  Although he was initially resistant to starting medication, he sent a message after the appt indicating he would start low dose Citalopram.  Will continue to follow closely.

## 2017-07-21 NOTE — Telephone Encounter (Signed)
Paperwork faxed back

## 2017-07-23 ENCOUNTER — Ambulatory Visit: Payer: BLUE CROSS/BLUE SHIELD | Admitting: Family Medicine

## 2017-07-27 ENCOUNTER — Telehealth: Payer: Self-pay | Admitting: Family Medicine

## 2017-07-27 NOTE — Telephone Encounter (Signed)
Copied from [place] [id number]. Topic: Quick Communication - See Telephone Encounter >> Jul 27, 2017  9:35 AM Rutherford Nail, NT wrote: CRM for notification. See Telephone encounter for: 07/27/17. Mickel Baas from [facility] and Short term disability office calling and is requesting office notes from patient's visit from 07/19/17. Please advise. CB#: [phone]  Fax#: [phone].

## 2017-07-27 NOTE — Telephone Encounter (Signed)
Spoke with pt, ok to send office notes from 4/29.

## 2017-07-27 NOTE — Telephone Encounter (Signed)
Notes are being sent by Levada Dy at the front desk.

## 2017-08-06 ENCOUNTER — Telehealth: Payer: Self-pay | Admitting: Family Medicine

## 2017-08-06 ENCOUNTER — Ambulatory Visit: Payer: BLUE CROSS/BLUE SHIELD | Admitting: Family Medicine

## 2017-08-06 ENCOUNTER — Encounter: Payer: Self-pay | Admitting: Family Medicine

## 2017-08-06 ENCOUNTER — Other Ambulatory Visit: Payer: Self-pay

## 2017-08-06 VITALS — BP 126/80 | HR 81 | Temp 98.0°F | Resp 16 | Ht 70.0 in | Wt 248.1 lb

## 2017-08-06 DIAGNOSIS — F329 Major depressive disorder, single episode, unspecified: Secondary | ICD-10-CM | POA: Diagnosis not present

## 2017-08-06 DIAGNOSIS — F419 Anxiety disorder, unspecified: Secondary | ICD-10-CM

## 2017-08-06 DIAGNOSIS — F32A Depression, unspecified: Secondary | ICD-10-CM

## 2017-08-06 DIAGNOSIS — Z0279 Encounter for issue of other medical certificate: Secondary | ICD-10-CM

## 2017-08-06 NOTE — Telephone Encounter (Signed)
Received paperwork for a "Request for Medical Information", via fax. In reference to a Disability Extension. Requesting that the forms be reviewed, completed and faxed back with most recent office notes.  Placed in front bin with charge sheet.

## 2017-08-06 NOTE — Assessment & Plan Note (Signed)
Ongoing issue.  Tolerating the Citalopram w/o difficulty.  Minimal improvement in mood but his energy level and motivation has improved.  He is now exercising regularly- which will also help his mood.  Discussed extending his leave until June 10th to allow him to get through his son's graduation.  Encouraged him to start counseling- referral placed.  Also encouraged him to start Al-anon or Nar-anon and meeting locations, dates, and times were printed for him.  Will need to complete additional FMLA forms and return to work forms for pt.  Total time spent w/ pt ~30 min, >50% spent counseling

## 2017-08-06 NOTE — Telephone Encounter (Signed)
Paperwork given to pcp for completion.   

## 2017-08-06 NOTE — Patient Instructions (Addendum)
Follow up in 1 month to recheck mood CONTINUE the Citalopram daily Please consider starting Al-anon meetings- this is for families of those w/ addiction I entered a counseling referral- they should call you Call with any questions or concerns Hang in there!!!

## 2017-08-06 NOTE — Progress Notes (Signed)
   Subjective:    Patient ID: George Needle., male    DOB: [birth year], 52 y.o.   MRN: [id number]  HPI Anxiety/Depression- pt was started on Citalopram 2 weeks ago.  Pt was in the gym 4 days last week and 4 days this week.  Pt reports mood and BP are somewhat better.  Stress levels are unchanged- wife OD'd on prescription opiates in December.  She is again taking too many meds (Nucynta and Flexeril).  Pt has taken meds away from her at this time.  Wife admits to having a problem but is not willing to seek help.   Review of Systems For ROS see HPI     Objective:   Physical Exam  Constitutional: He is oriented to person, place, and time. He appears well-developed and well-nourished. No distress.  HENT:  Head: Normocephalic and atraumatic.  Neurological: He is alert and oriented to person, place, and time.  Skin: Skin is warm and dry.  Psychiatric: His behavior is normal. Thought content normal.  Flat affect, withdrawn  Vitals reviewed.         Assessment & Plan:

## 2017-08-11 NOTE — Telephone Encounter (Signed)
Forms completed and given to CMA to print office notes

## 2017-08-11 NOTE — Telephone Encounter (Signed)
Forms faxed back.

## 2017-08-11 NOTE — Telephone Encounter (Signed)
Paperwork completed and placed in bin.

## 2017-08-20 ENCOUNTER — Telehealth: Payer: Self-pay | Admitting: General Practice

## 2017-08-20 NOTE — Telephone Encounter (Signed)
Ok to schedule an appt to come in sooner to be reevaluated.   Copied from [place] [phone]. Topic: Appointment Scheduling - Scheduling Inquiry for Clinic >> Aug 20, 2017  9:57 AM Ether Griffins B wrote: Reason for CRM: pt has an appt on 08/27/17 for release to go back to work. He is suppose to return to work on June 11th. He is hoping he might be able to come in sooner to insure his work processes everything for him to start work on 6/11.

## 2017-08-20 NOTE — Telephone Encounter (Signed)
PT rescheduled for 08/26/17 @ 11:00am.

## 2017-08-26 ENCOUNTER — Encounter: Payer: Self-pay | Admitting: Family Medicine

## 2017-08-26 ENCOUNTER — Other Ambulatory Visit: Payer: Self-pay

## 2017-08-26 ENCOUNTER — Encounter

## 2017-08-26 ENCOUNTER — Ambulatory Visit: Payer: BLUE CROSS/BLUE SHIELD | Admitting: Family Medicine

## 2017-08-26 VITALS — BP 130/82 | HR 97 | Temp 98.9°F | Resp 16 | Ht 70.0 in | Wt 246.1 lb

## 2017-08-26 DIAGNOSIS — F329 Major depressive disorder, single episode, unspecified: Secondary | ICD-10-CM | POA: Diagnosis not present

## 2017-08-26 DIAGNOSIS — F419 Anxiety disorder, unspecified: Secondary | ICD-10-CM | POA: Diagnosis not present

## 2017-08-26 DIAGNOSIS — F32A Depression, unspecified: Secondary | ICD-10-CM

## 2017-08-26 NOTE — Progress Notes (Signed)
   Subjective:    Patient ID: [name]., male    DOB: 1965/11/04, 52 y.o.   MRN: [id number]  HPI Depression/anxiety- pt is taking his Celexa 10mg  daily.  Pt feels dose is appropriate.  Has counseling upcoming but this doesn't start until July.  Pt is supposed to return to work on Monday 6/10.  He continues to struggle w/ wife's addictions (benzos and opiates).  Pt has talked to [facility]- wife is not willing to go.   Review of Systems For ROS see HPI     Objective:   Physical Exam  Constitutional: He is oriented to person, place, and time. He appears well-developed and well-nourished. No distress.  HENT:  Head: Normocephalic and atraumatic.  Neurological: He is alert and oriented to person, place, and time.  Psychiatric: He has a normal mood and affect. His behavior is normal. Thought content normal.  Vitals reviewed.         Assessment & Plan:

## 2017-08-26 NOTE — Patient Instructions (Signed)
Follow up in 3 months to recheck BP and mood (can cancel the 6/19 appt) Continue the Citalopram daily Continue to work on healthy diet and regular exercise- you can do it! Call with any questions or concerns Hang in there!!!

## 2017-08-26 NOTE — Assessment & Plan Note (Signed)
Improving.  Pt is doing well on his Citalopram.  Pt has counseling upcoming.  Applauded his efforts at scheduling.  Completed return to work forms indicating that changing his shift from 7pm-3am would benefit him emotionally.  Will continue to follow closely.  Total time spent w/ pt, 25 minutes, >50% spent counseling

## 2017-08-27 ENCOUNTER — Telehealth: Payer: Self-pay | Admitting: Family Medicine

## 2017-08-27 ENCOUNTER — Ambulatory Visit: Payer: Self-pay | Admitting: Family Medicine

## 2017-08-27 NOTE — Telephone Encounter (Signed)
Pt stated that he forgot to discuss a cough that he gets when getting hot and hasl cough in the mornings when getting up as well. Pt wanted to know if there was any thing that he could do differently and would like a call back to discuss this. I meant to put this note in yesterday, sorry.

## 2017-08-27 NOTE — Telephone Encounter (Signed)
He needs to be taking daily OTC Claritin or Zyrtec to decrease PND (which causes AM and PM cough and worsens w/ humidity)

## 2017-08-27 NOTE — Telephone Encounter (Signed)
Called and left a detailed message on voicemail (per DPR) to inform pt of pcp recommendations.

## 2017-09-07 ENCOUNTER — Ambulatory Visit: Payer: BLUE CROSS/BLUE SHIELD | Admitting: Family Medicine

## 2017-10-08 ENCOUNTER — Other Ambulatory Visit: Payer: Self-pay

## 2017-10-08 ENCOUNTER — Encounter: Payer: Self-pay | Admitting: Physician Assistant

## 2017-10-08 ENCOUNTER — Ambulatory Visit: Payer: BLUE CROSS/BLUE SHIELD | Admitting: Physician Assistant

## 2017-10-08 VITALS — BP 132/80 | HR 93 | Temp 98.4°F | Resp 16 | Ht 70.0 in | Wt 253.0 lb

## 2017-10-08 DIAGNOSIS — M109 Gout, unspecified: Secondary | ICD-10-CM | POA: Diagnosis not present

## 2017-10-08 MED ORDER — COLCHICINE 0.6 MG PO TABS: ORAL_TABLET | ORAL | 0 refills | Status: DC

## 2017-10-08 NOTE — Patient Instructions (Signed)
Please keep well-hydrated and get plenty of rest.  Take the colchicine as directed. If you need more than 3 days, stop and call me.   Limit trigger foods.    Gout Gout is painful swelling that can happen in some of your joints. Gout is a type of arthritis. This condition is caused by having too much uric acid in your body. Uric acid is a chemical that is made when your body breaks down substances called purines. If your body has too much uric acid, sharp crystals can form and build up in your joints. This causes pain and swelling. Gout attacks can happen quickly and be very painful (acute gout). Over time, the attacks can affect more joints and happen more often (chronic gout). Follow these instructions at home: During a Gout Attack  If directed, put ice on the painful area: ? Put ice in a plastic bag. ? Place a towel between your skin and the bag. ? Leave the ice on for 20 minutes, 2-3 times a day.  Rest the joint as much as possible. If the joint is in your leg, you may be given crutches to use.  Raise (elevate) the painful joint above the level of your heart as often as you can.  Drink enough fluids to keep your pee (urine) clear or pale yellow.  Take over-the-counter and prescription medicines only as told by your doctor.  Do not drive or use heavy machinery while taking prescription pain medicine.  Follow instructions from your doctor about what you can or cannot eat and drink.  Return to your normal activities as told by your doctor. Ask your doctor what activities are safe for you. Avoiding Future Gout Attacks  Follow a low-purine diet as told by a specialist (dietitian) or your doctor. Avoid foods and drinks that have a lot of purines, such as: ? Liver. ? Kidney. ? Anchovies. ? Asparagus. ? Herring. ? Mushrooms ? Mussels. ? Beer.  Limit alcohol intake to no more than 1 drink a day for nonpregnant women and 2 drinks a day for men. One drink equals 12 oz of beer, 5 oz  of wine, or 1 oz of hard liquor.  Stay at a healthy weight or lose weight if you are overweight. If you want to lose weight, talk with your doctor. It is important that you do not lose weight too fast.  Start or continue an exercise plan as told by your doctor.  Drink enough fluids to keep your pee clear or pale yellow.  Take over-the-counter and prescription medicines only as told by your doctor.  Keep all follow-up visits as told by your doctor. This is important. Contact a doctor if:  You have another gout attack.  You still have symptoms of a gout attack after10 days of treatment.  You have problems (side effects) because of your medicines.  You have chills or a fever.  You have burning pain when you pee (urinate).  You have pain in your lower back or belly. Get help right away if:  You have very bad pain.  Your pain cannot be controlled.  You cannot pee. This information is not intended to replace advice given to you by your health care provider. Make sure you discuss any questions you have with your health care provider. Document Released: 12/17/2007 Document Revised: 08/15/2015 Document Reviewed: 12/20/2014 Elsevier Interactive Patient Education  [employer].

## 2017-10-08 NOTE — Progress Notes (Signed)
Patient presents to clinic today c/o 2 days of erythema, warmth and pain of R ankle. Denies any known trauma or injury. Has history of gout of this joint. Notes recent increase in his typical trigger foods. Denies fever, chills,malaise or fatigue. Has been elevating extremity. Has not taken anything for symptoms.  Past Medical History:  Diagnosis Date  . Asthma   . Gout   . H/O vasectomy   . Hypertension     Current Outpatient Medications on File Prior to Visit  Medication Sig Dispense Refill  . albuterol (PROAIR HFA) 108 (90 Base) MCG/ACT inhaler INHALE TWO   PUFFS INTO THE LUNGS EVERY SIX HOURS   AS NEEDED   FOR WHEEZING 2 Inhaler 1  . budesonide-formoterol (SYMBICORT) 80-4.5 MCG/ACT inhaler INHALE TWO   PUFFS INTO THE LUNGS TWO   (TWO) TIMES DAILY. 2 Inhaler 1  . citalopram (CELEXA) 10 MG tablet Take 1 tablet (10 mg total) by mouth daily. 30 tablet 3  . clotrimazole-betamethasone (LOTRISONE) cream Apply 1 application topically 2 (two) times daily. For 2 weeks max 30 g 0  . hydrochlorothiazide (HYDRODIURIL) 12.5 MG tablet Take 1 tablet (12.5 mg total) by mouth daily. 90 tablet 3  . loratadine (ALLERGY RELIEF) 10 MG tablet Take 10 mg by mouth daily.    . meloxicam (MOBIC) 15 MG tablet Take 1 tablet (15 mg total) by mouth daily. 30 tablet 1  . omeprazole (PRILOSEC) 40 MG capsule Take 1 capsule (40 mg total) by mouth daily. 30 capsule 3  . sildenafil (VIAGRA) 100 MG tablet Take 1/2 - 1 tablet by mouth as needed for erectile dysfunction. 10 tablet 3   No current facility-administered medications on file prior to visit.     No Known Allergies  Family History  Problem Relation Age of Onset  . Hypertension Mother   . Diabetes Mother   . Hypertension Father   . Prostate cancer Paternal Uncle     Social History   Socioeconomic History  . Marital status: Married    Spouse name: Not on file  . Number of children: Not on file  . Years of education: Not on file  . Highest education  level: Not on file  Occupational History  . Occupation: Designer, fashion/clothing Needs  . Financial resource strain: Not on file  . Food insecurity:    Worry: Not on file    Inability: Not on file  . Transportation needs:    Medical: Not on file    Non-medical: Not on file  Tobacco Use  . Smoking status: Never Smoker  . Smokeless tobacco: Never Used  Substance and Sexual Activity  . Alcohol use: Yes    Alcohol/week: 0.0 oz    Comment: occ.  . Drug use: No  . Sexual activity: Not on file  Lifestyle  . Physical activity:    Days per week: Not on file    Minutes per session: Not on file  . Stress: Not on file  Relationships  . Social connections:    Talks on phone: Not on file    Gets together: Not on file    Attends religious service: Not on file    Active member of club or organization: Not on file    Attends meetings of clubs or organizations: Not on file    Relationship status: Not on file  Other Topics Concern  . Not on file  Social History Narrative  . Not on file   Review of Systems -  See HPI.  All other ROS are negative.  BP 132/80   Pulse 93   Temp 98.4 F (36.9 C) (Oral)   Resp 16   Ht 5\' 10"  (1.778 m)   Wt 253 lb (114.8 kg)   SpO2 97%   BMI 36.30 kg/m   Physical Exam  Constitutional: He appears well-developed and well-nourished.  HENT:  Head: Normocephalic and atraumatic.  Cardiovascular: Normal rate and regular rhythm.  Pulmonary/Chest: Effort normal.  Skin:     Vitals reviewed.   Assessment/Plan: 1. Acute gout of right ankle, unspecified cause Start Colchicine. 1.2 mg x 1 followed by 0.6 mg 1 hour later today. Then 1 tab BID PRN over the next few days. Supportive measures and OTC regimens reviewed.   - colchicine 0.6 MG tablet; Take 2 tablets by mouth. Take the 3rd tablet 1 hour later. May take 1 tablet twice daily on Day 2 onward if needed.  Dispense: 10 tablet; Refill: 0   Leeanne Rio, PA-C

## 2017-10-13 ENCOUNTER — Ambulatory Visit: Payer: BLUE CROSS/BLUE SHIELD | Admitting: Psychology

## 2017-10-20 ENCOUNTER — Ambulatory Visit: Payer: BLUE CROSS/BLUE SHIELD | Admitting: Psychology

## 2017-10-27 ENCOUNTER — Ambulatory Visit: Payer: Self-pay | Admitting: Psychology

## 2017-11-26 ENCOUNTER — Other Ambulatory Visit: Payer: Self-pay

## 2017-11-26 ENCOUNTER — Ambulatory Visit (INDEPENDENT_AMBULATORY_CARE_PROVIDER_SITE_OTHER): Payer: BLUE CROSS/BLUE SHIELD | Admitting: Family Medicine

## 2017-11-26 ENCOUNTER — Encounter: Payer: Self-pay | Admitting: Family Medicine

## 2017-11-26 VITALS — BP 126/84 | HR 86 | Temp 98.0°F | Resp 16 | Ht 70.0 in | Wt 253.8 lb

## 2017-11-26 DIAGNOSIS — F32A Depression, unspecified: Secondary | ICD-10-CM

## 2017-11-26 DIAGNOSIS — Z23 Encounter for immunization: Secondary | ICD-10-CM | POA: Diagnosis not present

## 2017-11-26 DIAGNOSIS — F329 Major depressive disorder, single episode, unspecified: Secondary | ICD-10-CM | POA: Diagnosis not present

## 2017-11-26 DIAGNOSIS — F419 Anxiety disorder, unspecified: Secondary | ICD-10-CM

## 2017-11-26 DIAGNOSIS — I10 Essential (primary) hypertension: Secondary | ICD-10-CM | POA: Diagnosis not present

## 2017-11-26 LAB — CBC WITH DIFFERENTIAL/PLATELET
BASOS ABS: 0 10*3/uL (ref 0.0–0.1)
Basophils Relative: 0.3 % (ref 0.0–3.0)
Eosinophils Absolute: 0.4 10*3/uL (ref 0.0–0.7)
Eosinophils Relative: 4.2 % (ref 0.0–5.0)
HCT: 43.4 % (ref 39.0–52.0)
Hemoglobin: 14.2 g/dL (ref 13.0–17.0)
LYMPHS ABS: 3.5 10*3/uL (ref 0.7–4.0)
Lymphocytes Relative: 39.4 % (ref 12.0–46.0)
MCHC: 32.7 g/dL (ref 30.0–36.0)
MCV: 79.9 fl (ref 78.0–100.0)
MONO ABS: 0.7 10*3/uL (ref 0.1–1.0)
MONOS PCT: 7.8 % (ref 3.0–12.0)
NEUTROS PCT: 48.3 % (ref 43.0–77.0)
Neutro Abs: 4.2 10*3/uL (ref 1.4–7.7)
Platelets: 161 10*3/uL (ref 150.0–400.0)
RBC: 5.43 Mil/uL (ref 4.22–5.81)
RDW: 15.1 % (ref 11.5–15.5)
WBC: 8.8 10*3/uL (ref 4.0–10.5)

## 2017-11-26 LAB — LIPID PANEL
CHOLESTEROL: 127 mg/dL (ref 0–200)
HDL: 27.1 mg/dL — AB (ref >39.00)
LDL Cholesterol: 75 mg/dL (ref 0–99)
NonHDL: 99.65
Total CHOL/HDL Ratio: 5
Triglycerides: 121 mg/dL (ref 0.0–149.0)
VLDL: 24.2 mg/dL (ref 0.0–40.0)

## 2017-11-26 LAB — BASIC METABOLIC PANEL
BUN: 16 mg/dL (ref 6–23)
CALCIUM: 8.8 mg/dL (ref 8.4–10.5)
CO2: 28 mEq/L (ref 19–32)
Chloride: 107 mEq/L (ref 96–112)
Creatinine, Ser: 0.89 mg/dL (ref 0.40–1.50)
GFR: 95.15 mL/min (ref >60.00)
GLUCOSE: 92 mg/dL (ref 70–99)
Potassium: 4.2 mEq/L (ref 3.5–5.1)
SODIUM: 142 meq/L (ref 135–145)

## 2017-11-26 LAB — HEPATIC FUNCTION PANEL
ALBUMIN: 4.1 g/dL (ref 3.5–5.2)
ALT: 26 U/L (ref 0–53)
AST: 19 U/L (ref 0–37)
Alkaline Phosphatase: 82 U/L (ref 39–117)
Bilirubin, Direct: 0.2 mg/dL (ref 0.0–0.3)
Total Bilirubin: 1.1 mg/dL (ref 0.2–1.2)
Total Protein: 5.7 g/dL — ABNORMAL LOW (ref 6.0–8.3)

## 2017-11-26 MED ORDER — SILDENAFIL CITRATE 100 MG PO TABS: ORAL_TABLET | ORAL | 3 refills | Status: DC

## 2017-11-26 NOTE — Progress Notes (Signed)
   Subjective:    Patient ID: George Needle., male    DOB: [birth year], 52 y.o.   MRN: [id number]  HPI HTN- chronic problem, on HCTZ 12.5mg  daily w/ good control.  Denies CP, SOB, HAs, visual changes, edema.  Anxiety/depression- pt stopped his Citalopram.  'my life got busy'.  Pt feels anxiety is 'ok right now'.  Still working nights but company is supposed to hire additional people and then he will move to days.  Pt reports he is sleeping better.   Review of Systems For ROS see HPI     Objective:   Physical Exam  Constitutional: He is oriented to person, place, and time. He appears well-developed and well-nourished. No distress.  obese  HENT:  Head: Normocephalic and atraumatic.  Eyes: Pupils are equal, round, and reactive to light. Conjunctivae and EOM are normal.  Neck: Normal range of motion. Neck supple. No thyromegaly present.  Cardiovascular: Normal rate, regular rhythm, normal heart sounds and intact distal pulses.  No murmur heard. Pulmonary/Chest: Effort normal and breath sounds normal. No respiratory distress.  Abdominal: Soft. Bowel sounds are normal. He exhibits no distension.  Musculoskeletal: He exhibits no edema.  Lymphadenopathy:    He has no cervical adenopathy.  Neurological: He is alert and oriented to person, place, and time. No cranial nerve deficit.  Skin: Skin is warm and dry.  Psychiatric: He has a normal mood and affect. His behavior is normal.  Vitals reviewed.         Assessment & Plan:

## 2017-11-26 NOTE — Patient Instructions (Addendum)
Schedule your complete physical for after 2/11 We'll notify you of your lab results and make any changes if needed Continue to work on healthy diet and regular exercise- you can do it! Continue the HCTZ daily Call with any questions or concerns Have a great weekend! Happy Fall!

## 2017-11-26 NOTE — Assessment & Plan Note (Signed)
Chronic problem.  Adequate control today.  Asymptomatic.  Check labs.  No anticipated med changes.  Will follow. 

## 2017-11-26 NOTE — Assessment & Plan Note (Signed)
Improved.  Pt stopped the Celexa and at this time doesn't feel medication is needed.  Will continue to follow.

## 2017-11-29 ENCOUNTER — Ambulatory Visit: Payer: Self-pay | Admitting: Family Medicine

## 2018-01-27 ENCOUNTER — Other Ambulatory Visit: Payer: Self-pay | Admitting: Family Medicine

## 2018-02-01 ENCOUNTER — Other Ambulatory Visit: Payer: Self-pay | Admitting: Family Medicine

## 2018-02-02 ENCOUNTER — Ambulatory Visit: Payer: BLUE CROSS/BLUE SHIELD | Admitting: Family Medicine

## 2018-02-02 ENCOUNTER — Encounter: Payer: Self-pay | Admitting: Family Medicine

## 2018-02-02 ENCOUNTER — Other Ambulatory Visit: Payer: Self-pay

## 2018-02-02 VITALS — BP 120/84 | HR 94 | Temp 97.9°F | Ht 70.0 in

## 2018-02-02 DIAGNOSIS — G609 Hereditary and idiopathic neuropathy, unspecified: Secondary | ICD-10-CM | POA: Diagnosis not present

## 2018-02-02 DIAGNOSIS — M79673 Pain in unspecified foot: Secondary | ICD-10-CM

## 2018-02-02 DIAGNOSIS — M722 Plantar fascial fibromatosis: Secondary | ICD-10-CM | POA: Diagnosis not present

## 2018-02-02 LAB — VITAMIN B12: VITAMIN B 12: 190 pg/mL — AB (ref 211–911)

## 2018-02-02 LAB — FOLATE: Folate: 13.7 ng/mL (ref >5.9)

## 2018-02-02 MED ORDER — DICLOFENAC SODIUM 75 MG PO TBEC: 75.0000 mg | DELAYED_RELEASE_TABLET | Freq: Two times a day (BID) | ORAL | 0 refills | Status: DC

## 2018-02-02 NOTE — Progress Notes (Signed)
Subjective  CC:  Chief Complaint  Patient presents with  . Foot Pain    swelling and pain int he bottom of both feet    HPI: [name]. is a 52 y.o. male who presents to the office today to address the problems listed above in the chief complaint. Same day acute visit; PCP not available. New pt to me. Chart reviewed.    C/o foot pain. Vague historian: 2 weeks of paresthesias bilateral feet; "tingling" comes and goes w/o pain or burning. Also with mid foot pain especially if wears unsupportive shoes like flip flops; and heel pain. Works night shift and is walking on concrete floors. Has changed to more supportive shoes with mild relief. No ankle or joint pain in feet. No swelling. No redness. No trauma. No change in color of toes. Has h/o gout but this is different.   Assessment  1. Plantar fasciitis   2. Arch pain, unspecified laterality   3. Idiopathic peripheral neuropathy      Plan   Foot pain, bilateral:  Suspect multifactorial; discussed mid foot pain due to need for arch support; plantar fasciitis and new peripheral neuropathy. Check labs, nsaids, ice and stretches for heel pain. To discuss with ortho if orthotics would be beneficial. Monitor paresthesias for now; to f/u with PCP for worsening pain to consider emg/ncv and/or gabapentin.   Follow up: Return in about 8 weeks (around 03/30/2018) for recheck foot pain.   Orders Placed This Encounter  Procedures  . Vitamin B12  . Folate   Meds ordered this encounter  Medications  . diclofenac (VOLTAREN) 75 MG EC tablet    Sig: Take 1 tablet (75 mg total) by mouth 2 (two) times daily.    Dispense:  30 tablet    Refill:  0      I reviewed the patients updated PMH, FH, and SocHx.    Patient Active Problem List   Diagnosis Date Noted  . Anxiety and depression 05/17/2017  . Polyarthritis 12/28/2016  . Erectile dysfunction 12/18/2015  . Postprandial bloating 06/28/2015  . HTN (hypertension), benign 02/08/2015  .  Insomnia 02/08/2015  . Dysuria 12/14/2014  . Atypical nevus of back 06/27/2014  . GERD (gastroesophageal reflux disease) 02/20/2014  . Pain in joint, ankle and foot 11/08/2013  . Low testosterone 04/24/2013  . Routine general medical examination at a health care facility 04/24/2013  . Obesity (BMI 30-39.9) 04/24/2013  . Decreased libido 10/31/2012  . Testicular pain 09/29/2011  . Asthma 03/12/2011  . NEOPLASM, SKIN, UNCERTAIN BEHAVIOR 72/11/4707  . FATIGUE 02/03/2010  . HEADACHE 06/08/2007  . GOUT 05/24/2007  . ALLERGIC RHINITIS 10/19/2006   Current Meds  Medication Sig  . budesonide-formoterol (SYMBICORT) 80-4.5 MCG/ACT inhaler INHALE TWO   PUFFS INTO THE LUNGS TWO   (TWO) TIMES DAILY.  Marland Kitchen colchicine 0.6 MG tablet Take 2 tablets by mouth. Take the 3rd tablet 1 hour later. May take 1 tablet twice daily on Day 2 onward if needed.  . hydrochlorothiazide (HYDRODIURIL) 12.5 MG tablet Take 1 tablet (12.5 mg total) by mouth daily.  Marland Kitchen loratadine (ALLERGY RELIEF) 10 MG tablet Take 10 mg by mouth daily.  . sildenafil (VIAGRA) 100 MG tablet Take 1/2 - 1 tablet by mouth as needed for erectile dysfunction.    Allergies: Patient has No Known Allergies. Family History: Patient family history includes Diabetes in his mother; Hypertension in his father and mother; Prostate cancer in his paternal uncle. Social History:  Patient  reports that he  has never smoked. He has never used smokeless tobacco. He reports that he drinks alcohol. He reports that he does not use drugs.  Review of Systems: Constitutional: Negative for fever malaise or anorexia Cardiovascular: negative for chest pain Respiratory: negative for SOB or persistent cough Gastrointestinal: negative for abdominal pain  Objective  Vitals: BP 120/84   Pulse 94   Temp 97.9 F (36.6 C)   Ht 5\' 10"  (1.778 m)   SpO2 97%   BMI 36.42 kg/m  General: no acute distress , A&Ox3 Feet: nl appearing, no LE edema, + 2 distal pulses, no rash,  warmth or tender/swollen joints. + ttp at bilateral medial heels w/o achilles ttp. Normal ankles B   Commons side effects, risks, benefits, and alternatives for medications and treatment plan prescribed today were discussed, and the patient expressed understanding of the given instructions. Patient is instructed to call or message via MyChart if he/she has any questions or concerns regarding our treatment plan. No barriers to understanding were identified. We discussed Red Flag symptoms and signs in detail. Patient expressed understanding regarding what to do in case of urgent or emergency type symptoms.   Medication list was reconciled, printed and provided to the patient in AVS. Patient instructions and summary information was reviewed with the patient as documented in the AVS. This note was prepared with assistance of Dragon voice recognition software. Occasional wrong-word or sound-a-like substitutions may have occurred due to the inherent limitations of voice recognition software

## 2018-02-02 NOTE — Patient Instructions (Signed)
Please return in 4-8 weeks with Dr. Birdie Riddle to recheck neuropathy and foot pain.   If you have any questions or concerns, please don't hesitate to send me a message via MyChart or call the office at [phone]. Thank you for visiting with [place] today! It's our pleasure caring for you.   Exercise to Lose Weight Exercise and a healthy diet may help you lose weight. Your doctor may suggest specific exercises. EXERCISE IDEAS AND TIPS  Choose low-cost things you enjoy doing, such as walking, bicycling, or exercising to workout videos.  Take stairs instead of the elevator.  Walk during your lunch break.  Park your car further away from work or school.  Go to a gym or an exercise class.  Start with 5 to 10 minutes of exercise each day. Build up to 30 minutes of exercise 4 to 6 days a week.  Wear shoes with good support and comfortable clothes.  Stretch before and after working out.  Work out until you breathe harder and your heart beats faster.  Drink extra water when you exercise.  Do not do so much that you hurt yourself, feel dizzy, or get very short of breath. Exercises that burn about 150 calories:  Running 1  miles in 15 minutes.  Playing volleyball for 45 to 60 minutes.  Washing and waxing a car for 45 to 60 minutes.  Playing touch football for 45 minutes.  Walking 1  miles in 35 minutes.  Pushing a stroller 1  miles in 30 minutes.  Playing basketball for 30 minutes.  Raking leaves for 30 minutes.  Bicycling 5 miles in 30 minutes.  Walking 2 miles in 30 minutes.  Dancing for 30 minutes.  Shoveling snow for 15 minutes.  Swimming laps for 20 minutes.  Walking up stairs for 15 minutes.  Bicycling 4 miles in 15 minutes.  Gardening for 30 to 45 minutes.  Jumping rope for 15 minutes.  Washing windows or floors for 45 to 60 minutes.    Plantar Fasciitis Plantar fasciitis is a painful foot condition that affects the heel. It occurs when the band of  tissue that connects the toes to the heel bone (plantar fascia) becomes irritated. This can happen after exercising too much or doing other repetitive activities (overuse injury). The pain from plantar fasciitis can range from mild irritation to severe pain that makes it difficult for you to walk or move. The pain is usually worse in the morning or after you have been sitting or lying down for a while. What are the causes? This condition may be caused by:  Standing for long periods of time.  Wearing shoes that do not fit.  Doing high-impact activities, including running, aerobics, and ballet.  Being overweight.  Having an abnormal way of walking (gait).  Having tight calf muscles.  Having high arches in your feet.  Starting a new athletic activity.  What are the signs or symptoms? The main symptom of this condition is heel pain. Other symptoms include:  Pain that gets worse after activity or exercise.  Pain that is worse in the morning or after resting.  Pain that goes away after you walk for a few minutes.  How is this diagnosed? This condition may be diagnosed based on your signs and symptoms. Your health care provider will also do a physical exam to check for:  A tender area on the bottom of your foot.  A high arch in your foot.  Pain when you move your foot.  Difficulty moving your foot.  You may also need to have imaging studies to confirm the diagnosis. These can include:  X-rays.  Ultrasound.  MRI.  How is this treated? Treatment for plantar fasciitis depends on the severity of the condition. Your treatment may include:  Rest, ice, and over-the-counter pain medicines to manage your pain.  Exercises to stretch your calves and your plantar fascia.  A splint that holds your foot in a stretched, upward position while you sleep (night splint).  Physical therapy to relieve symptoms and prevent problems in the future.  Cortisone injections to relieve severe  pain.  Extracorporeal shock wave therapy (ESWT) to stimulate damaged plantar fascia with electrical impulses. It is often used as a last resort before surgery.  Surgery, if other treatments have not worked after 12 months.  Follow these instructions at home:  Take medicines only as directed by your health care provider.  Avoid activities that cause pain.  Roll the bottom of your foot over a bag of ice or a bottle of cold water. Do this for 20 minutes, 3-4 times a day.  Perform simple stretches as directed by your health care provider.  Try wearing athletic shoes with air-sole or gel-sole cushions or soft shoe inserts.  Wear a night splint while sleeping, if directed by your health care provider.  Keep all follow-up appointments with your health care provider. How is this prevented?  Do not perform exercises or activities that cause heel pain.  Consider finding low-impact activities if you continue to have problems.  Lose weight if you need to. The best way to prevent plantar fasciitis is to avoid the activities that aggravate your plantar fascia. Contact a health care provider if:  Your symptoms do not go away after treatment with home care measures.  Your pain gets worse.  Your pain affects your ability to move or do your daily activities. This information is not intended to replace advice given to you by your health care provider. Make sure you discuss any questions you have with your health care provider. Document Released: 12/02/2000 Document Revised: 08/12/2015 Document Reviewed: 01/17/2014 Elsevier Interactive Patient Education  2018 [employer].  Peripheral Neuropathy Peripheral neuropathy is a type of nerve damage. It affects nerves that carry signals between the spinal cord and other parts of the body. These are called peripheral nerves. With peripheral neuropathy, one nerve or a group of nerves may be damaged. What are the causes? Many things can damage  peripheral nerves. For some people with peripheral neuropathy, the cause is unknown. Some causes include:  Diabetes. This is the most common cause of peripheral neuropathy.  Injury to a nerve.  Pressure or stress on a nerve that lasts a long time.  Too little vitamin B. Alcoholism can lead to this.  Infections.  Autoimmune diseases, such as multiple sclerosis and systemic lupus erythematosus.  Inherited nerve diseases.  Some medicines, such as cancer drugs.  Toxic substances, such as lead and mercury.  Too little blood flowing to the legs.  Kidney disease.  Thyroid disease.  What are the signs or symptoms? Different people have different symptoms. The symptoms you have will depend on which of your nerves is damaged. Common symptoms include:  Loss of feeling (numbness) in the feet and hands.  Tingling in the feet and hands.  Pain that burns.  Very sensitive skin.  Weakness.  Not being able to move a part of the body (paralysis).  Muscle twitching.  Clumsiness or poor coordination.  Loss of balance.  Not being able to control your bladder.  Feeling dizzy.  Sexual problems.  How is this diagnosed? Peripheral neuropathy is a symptom, not a disease. Finding the cause of peripheral neuropathy can be hard. To figure that out, your health care provider will take a medical history and do a physical exam. A neurological exam will also be done. This involves checking things affected by your brain, spinal cord, and nerves (nervous system). For example, your health care provider will check your reflexes, how you move, and what you can feel. Other types of tests may also be ordered, such as:  Blood tests.  A test of the fluid in your spinal cord.  Imaging tests, such as CT scans or an MRI.  Electromyography (EMG). This test checks the nerves that control muscles.  Nerve conduction velocity tests. These tests check how fast messages pass through your nerves.  Nerve  biopsy. A small piece of nerve is removed. It is then checked under a microscope.  How is this treated?  Medicine is often used to treat peripheral neuropathy. Medicines may include: ? Pain-relieving medicines. Prescription or over-the-counter medicine may be suggested. ? Antiseizure medicine. This may be used for pain. ? Antidepressants. These also may help ease pain from neuropathy. ? Lidocaine. This is a numbing medicine. You might wear a patch or be given a shot. ? Mexiletine. This medicine is typically used to help control irregular heart rhythms.  Surgery. Surgery may be needed to relieve pressure on a nerve or to destroy a nerve that is causing pain.  Physical therapy to help movement.  Assistive devices to help movement. Follow these instructions at home:  Only take over-the-counter or prescription medicines as directed by your health care provider. Follow the instructions carefully for any given medicines. Do not take any other medicines without first getting approval from your health care provider.  If you have diabetes, work closely with your health care provider to keep your blood sugar under control.  If you have numbness in your feet: ? Check every day for signs of injury or infection. Watch for redness, warmth, and swelling. ? Wear padded socks and comfortable shoes. These help protect your feet.  Do not do things that put pressure on your damaged nerve.  Do not smoke. Smoking keeps blood from getting to damaged nerves.  Avoid or limit alcohol. Too much alcohol can cause a lack of B vitamins. These vitamins are needed for healthy nerves.  Develop a good support system. Coping with peripheral neuropathy can be stressful. Talk to a mental health specialist or join a support group if you are struggling.  Follow up with your health care provider as directed. Contact a health care provider if:  You have new signs or symptoms of peripheral neuropathy.  You are  struggling emotionally from dealing with peripheral neuropathy.  You have a fever. Get help right away if:  You have an injury or infection that is not healing.  You feel very dizzy or begin vomiting.  You have chest pain.  You have trouble breathing. This information is not intended to replace advice given to you by your health care provider. Make sure you discuss any questions you have with your health care provider. Document Released: 02/27/2002 Document Revised: 08/15/2015 Document Reviewed: 11/14/2012 Elsevier Interactive Patient Education  2017 [employer].

## 2018-02-03 ENCOUNTER — Encounter: Payer: Self-pay | Admitting: Family Medicine

## 2018-02-03 DIAGNOSIS — E538 Deficiency of other specified B group vitamins: Secondary | ICD-10-CM | POA: Insufficient documentation

## 2018-02-21 ENCOUNTER — Other Ambulatory Visit: Payer: Self-pay

## 2018-02-21 ENCOUNTER — Ambulatory Visit: Payer: BLUE CROSS/BLUE SHIELD | Attending: Orthopedic Surgery | Admitting: Physical Therapy

## 2018-02-21 ENCOUNTER — Ambulatory Visit: Payer: BLUE CROSS/BLUE SHIELD

## 2018-02-21 DIAGNOSIS — M545 Low back pain, unspecified: Secondary | ICD-10-CM

## 2018-02-21 NOTE — Patient Instructions (Signed)
Leg Extension (Hamstring)   Sit toward front edge of chair, with leg out straight, heel on floor, toes pointing toward body. Keeping back straight, bend forward at hip, until a stretch is felt. Hold 30-60 seconds. Repeat _3__ times. Repeat with other leg. Do __2-3_ sessions per day.   Stretching: Piriformis (Supine)  Pull right knee toward opposite shoulder. Hold _30-60___ seconds. Relax. Repeat 3__ times per set. Do ____ sets per session. Do __2__ sessions per day. d.   Knee-to-Chest Stretch: Unilateral    With hand behind right knee, pull knee in to chest until a comfortable stretch is felt in lower back and buttocks. Keep back relaxed. Hold _30___ seconds. Repeat _3___ times per set. Do ____ sets per session. Do __2__ sessions per day.     Lower Trunk Rotation Stretch   Keeping back flat and feet together, rotate knees to left side. Hold __10__ seconds. Repeat __5__ times per set. Do ____ sets per session. Do _2_ sessions per day.   Madelyn Flavors, PT 02/21/18 2:31 PM; [address] [place] [place], [place], [address] Phone: [phone]   Fax:  [phone]

## 2018-02-21 NOTE — Therapy (Signed)
[place] [place], [place], [address] Phone: [phone]   Fax:  [phone]  Physical Therapy Evaluation  Patient Details  Name: [name]. MRN: [id number] Date of Birth: 1965-07-23 Referring Provider (PT): Melina Schools   Encounter Date: 02/21/2018  PT End of Session - 02/21/18 1349    Visit Number  1    Date for PT Re-Evaluation  04/18/18    PT Start Time  1349    PT Stop Time  1432    PT Time Calculation (min)  43 min    Activity Tolerance  Patient tolerated treatment well    Behavior During Therapy  [facility] for tasks assessed/performed       Past Medical History:  Diagnosis Date  . Asthma   . Gout   . H/O vasectomy   . Hypertension     Past Surgical History:  Procedure Laterality Date  . ANTERIOR CRUCIATE LIGAMENT REPAIR    . TONSILECTOMY, ADENOIDECTOMY, BILATERAL MYRINGOTOMY AND TUBES    . VASECTOMY      There were no vitals filed for this visit.   Subjective Assessment - 02/21/18 1358    Subjective  Pt reached into the dryer about 3 months ago and felt pain in his back. Since then he has had intermittent LBP with softball and ADLs.    Pertinent History  ACL surgery 1986, HTN    Diagnostic tests  xrays neg    Patient Stated Goals  to get rid of pain    Currently in Pain?  Yes    Pain Score  1    8/10 prior to prednisone   Pain Location  Back    Pain Orientation  Right;Left;Lower    Pain Descriptors / Indicators  Aching;Tightness    Pain Type  Acute pain    Pain Onset  More than a month ago    Pain Frequency  Constant    Aggravating Factors   bending, driving 45 min to work    Pain Relieving Factors  unsure    Effect of Pain on Daily Activities  pain at work         [facility] PT Assessment - 02/21/18 0001      Assessment   Medical Diagnosis  low back pain    Referring Provider (PT)  Melina Schools    Onset Date/Surgical Date  11/29/17    Prior Therapy  no      Precautions   Precautions  None      Balance Screen   Has the patient fallen in the past 6 months  No    Has the patient had a decrease in activity level because of a fear of falling?   No    Is the patient reluctant to leave their home because of a fear of falling?   No      Home Film/video editor residence    Additional Comments  two story home      Prior Function   Level of Independence  Independent    Vocation  Full time employment    Vocation Requirements  walking, lifting up to 40-40#    Leisure  softball 1st base      Posture/Postural Control   Posture/Postural Control  Postural limitations    Postural Limitations  Rounded Shoulders;Forward head;Decreased lumbar lordosis;Increased thoracic kyphosis;Posterior pelvic tilt    Posture Comments  tight left QL      ROM /  Strength   AROM / PROM / Strength  AROM;Strength      AROM   AROM Assessment Site  Lumbar    Lumbar Flexion  fingers 10 inch from floor    Lumbar Extension  WNL but bends knees    Lumbar - Right Side Bend  30%    Lumbar - Left Side Bend  15%    Lumbar - Right Rotation  90%    Lumbar - Left Rotation  75%      Strength   Overall Strength Comments  grossly 5/5 in BLE      Flexibility   Soft Tissue Assessment /Muscle Length  yes    Hamstrings  bil marked    Quadriceps  bil and R hip flexor    Piriformis  left and bil hip rotators    Quadratus Lumborum  left QL      Palpation   Spinal mobility  decreased PA mobility L 4/5, L5/S1 and L2/3 with pain central and unilateral    Palpation comment  tender in bil gluteals and lumbar paraspinals                 Objective measurements completed on examination: See above findings.              PT Education - 02/21/18 1431    Education Details  HEP    Person(s) Educated  Patient    Methods  Explanation;Demonstration;Handout    Comprehension  Verbalized understanding;Returned demonstration       PT Short Term  Goals - 02/21/18 1447      PT SHORT TERM GOAL #1   Title  Independent with stretching HEP    Time  2    Period  Weeks    Status  New    Target Date  03/07/18        PT Long Term Goals - 02/21/18 1448      PT LONG TERM GOAL #1   Title  Pt to report decreased pain in the low back by 75% or more with ADLS    Time  8    Period  Weeks    Status  New    Target Date  04/18/18      PT LONG TERM GOAL #2   Title  Patient able to drive to work with 00% less pain and stiffness afterwards.    Time  8    Period  Weeks    Status  New      PT LONG TERM GOAL #3   Title  Pt able to demonstrate correct body mechanics for lifting to avoid further injury    Time  8    Period  Weeks    Status  New      PT LONG TERM GOAL #4   Title  --             Plan - 02/21/18 1441    Clinical Impression Statement  Patient presents today with c/o of low back pain beginning 3 months ago when he bent into the clothes dryer. He has had intermittent back spasms in the past but this felt different. Xrays were negative. He is very inflexible in his spine and hips and has poor posture in standing and sitting as well. He is required to lift at work and also has pain driving his 45 min commute.    History and Personal Factors relevant to plan of care:  HTN, ACL repair    Clinical Presentation  Stable    Clinical Decision Making  Low    Rehab Potential  Excellent    PT Frequency  2x / week    PT Duration  8 weeks    PT Treatment/Interventions  ADLs/Self Care Home Management;Cryotherapy;Moist Heat;Traction;Ultrasound;Therapeutic activities;Therapeutic exercise;Neuromuscular re-education;Manual techniques;Dry needling    PT Next Visit Plan  work on flexibility and get his lumbar spine moving; try traction    PT Home Exercise Plan  LTR, SKTC, HS and piriformis stretches    Consulted and Agree with Plan of Care  Patient       Patient will benefit from skilled therapeutic intervention in order to improve the  following deficits and impairments:  Pain, Decreased mobility, Postural dysfunction, Decreased range of motion, Impaired flexibility  Visit Diagnosis: Acute bilateral low back pain without sciatica - Plan: PT plan of care cert/re-cert     Problem List Patient Active Problem List   Diagnosis Date Noted  . Vitamin B12 deficiency 02/03/2018  . Anxiety and depression 05/17/2017  . Polyarthritis 12/28/2016  . Erectile dysfunction 12/18/2015  . Postprandial bloating 06/28/2015  . HTN (hypertension), benign 02/08/2015  . Insomnia 02/08/2015  . Dysuria 12/14/2014  . Atypical nevus of back 06/27/2014  . GERD (gastroesophageal reflux disease) 02/20/2014  . Pain in joint, ankle and foot 11/08/2013  . Low testosterone 04/24/2013  . Routine general medical examination at a health care facility 04/24/2013  . Obesity (BMI 30-39.9) 04/24/2013  . Decreased libido 10/31/2012  . Testicular pain 09/29/2011  . Asthma 03/12/2011  . NEOPLASM, SKIN, UNCERTAIN BEHAVIOR 29/56/2130  . FATIGUE 02/03/2010  . HEADACHE 06/08/2007  . GOUT 05/24/2007  . ALLERGIC RHINITIS 10/19/2006    Madelyn Flavors PT 02/21/2018, 2:54 PM  [place] [address] [facility] [place], [place], [address] Phone: [phone]   Fax:  [phone]  Name: [name]. MRN: [id number] Date of Birth: 11/05/1965

## 2018-02-24 ENCOUNTER — Other Ambulatory Visit: Payer: Self-pay | Admitting: Family Medicine

## 2018-02-25 ENCOUNTER — Ambulatory Visit: Payer: BLUE CROSS/BLUE SHIELD | Admitting: Physical Therapy

## 2018-02-25 ENCOUNTER — Encounter: Payer: Self-pay | Admitting: Physical Therapy

## 2018-02-25 DIAGNOSIS — M545 Low back pain, unspecified: Secondary | ICD-10-CM

## 2018-02-25 NOTE — Therapy (Signed)
[address] [place], [place], [address] Phone: [phone]   Fax:  [phone]  Physical Therapy Treatment  Patient Details  Name: [name]. MRN: [id number] Date of Birth: Aug 14, 1965 Referring Provider (PT): Melina Schools   Encounter Date: 02/25/2018  PT End of Session - 02/25/18 0827    Visit Number  2    Date for PT Re-Evaluation  04/18/18    PT Start Time  0758    PT Stop Time  0840    PT Time Calculation (min)  42 min    Activity Tolerance  Patient tolerated treatment well    Behavior During Therapy  [facility] for tasks assessed/performed       Past Medical History:  Diagnosis Date  . Asthma   . Gout   . H/O vasectomy   . Hypertension     Past Surgical History:  Procedure Laterality Date  . ANTERIOR CRUCIATE LIGAMENT REPAIR    . TONSILECTOMY, ADENOIDECTOMY, BILATERAL MYRINGOTOMY AND TUBES    . VASECTOMY      There were no vitals filed for this visit.  Subjective Assessment - 02/25/18 0759    Subjective  "Im not awake, I work nights"    Currently in Pain?  No/denies    Pain Score  0-No pain                       [facility] Adult PT Treatment/Exercise - 02/25/18 0001      Exercises   Exercises  Lumbar      Lumbar Exercises: Aerobic   Nustep  L5 x 6 min       Lumbar Exercises: Machines for Strengthening   Leg Press  140lb 2x10     Other Lumbar Machine Exercise  Rows and lats 45lb 2x10       Lumbar Exercises: Standing   Other Standing Lumbar Exercises  Pulley ext 15lb 2x10     Other Standing Lumbar Exercises  yellow ball overhead ext 2x10       Lumbar Exercises: Seated   Sit to Stand  10 reps   x2      Lumbar Exercises: Supine   Bridge  15 reps;Compliant;2 seconds      Modalities   Modalities  Moist Heat               PT Short Term Goals - 02/25/18 0830      PT SHORT TERM GOAL #1   Title  Independent with stretching HEP    Status  Achieved         PT Long Term Goals - 02/21/18 1448      PT LONG TERM GOAL #1   Title  Pt to report decreased pain in the low back by 75% or more with ADLS    Time  8    Period  Weeks    Status  New    Target Date  04/18/18      PT LONG TERM GOAL #2   Title  Patient able to drive to work with 41% less pain and stiffness afterwards.    Time  8    Period  Weeks    Status  New      PT LONG TERM GOAL #3   Title  Pt able to demonstrate correct body mechanics for lifting to avoid further injury    Time  8    Period  Weeks    Status  New      PT LONG TERM GOAL #4   Title  --            Plan - 02/25/18 1308    Clinical Impression Statement  Pt tolerated an initial progression to TE well. requested higher wright on machine level interventions and demo ed good strength. No reports of increase pain, good stability with extensions after cues.     Rehab Potential  Excellent    PT Frequency  2x / week    PT Duration  8 weeks    PT Treatment/Interventions  ADLs/Self Care Home Management;Cryotherapy;Moist Heat;Traction;Ultrasound;Therapeutic activities;Therapeutic exercise;Neuromuscular re-education;Manual techniques;Dry needling    PT Next Visit Plan  work on flexibility and get his lumbar spine moving; try traction       Patient will benefit from skilled therapeutic intervention in order to improve the following deficits and impairments:  Pain, Decreased mobility, Postural dysfunction, Decreased range of motion, Impaired flexibility  Visit Diagnosis: Acute bilateral low back pain without sciatica     Problem List Patient Active Problem List   Diagnosis Date Noted  . Vitamin B12 deficiency 02/03/2018  . Anxiety and depression 05/17/2017  . Polyarthritis 12/28/2016  . Erectile dysfunction 12/18/2015  . Postprandial bloating 06/28/2015  . HTN (hypertension), benign 02/08/2015  . Insomnia 02/08/2015  . Dysuria 12/14/2014  . Atypical nevus of back 06/27/2014  . GERD (gastroesophageal  reflux disease) 02/20/2014  . Pain in joint, ankle and foot 11/08/2013  . Low testosterone 04/24/2013  . Routine general medical examination at a health care facility 04/24/2013  . Obesity (BMI 30-39.9) 04/24/2013  . Decreased libido 10/31/2012  . Testicular pain 09/29/2011  . Asthma 03/12/2011  . NEOPLASM, SKIN, UNCERTAIN BEHAVIOR 65/78/4696  . FATIGUE 02/03/2010  . HEADACHE 06/08/2007  . GOUT 05/24/2007  . ALLERGIC RHINITIS 10/19/2006    Scot Jun 02/25/2018, 8:31 AM  [address], [place], [address] Phone: [phone]   Fax:  [phone]  Name: Robbi Scurlock. MRN: [id number] Date of Birth: 1965/04/16

## 2018-02-28 ENCOUNTER — Ambulatory Visit: Payer: BLUE CROSS/BLUE SHIELD | Admitting: Physical Therapy

## 2018-02-28 DIAGNOSIS — M545 Low back pain, unspecified: Secondary | ICD-10-CM

## 2018-02-28 NOTE — Therapy (Signed)
[address] [place], [place], [address] Phone: [phone]   Fax:  [phone]  Physical Therapy Treatment  Patient Details  Name: [name]. MRN: [id number] Date of Birth: 1965/06/13 Referring Provider (PT): Melina Schools   Encounter Date: 02/28/2018  PT End of Session - 02/28/18 1436    Visit Number  3    Date for PT Re-Evaluation  04/18/18    PT Start Time  7939    PT Stop Time  1437    PT Time Calculation (min)  39 min    Activity Tolerance  Patient tolerated treatment well    Behavior During Therapy  [facility] for tasks assessed/performed       Past Medical History:  Diagnosis Date  . Asthma   . Gout   . H/O vasectomy   . Hypertension     Past Surgical History:  Procedure Laterality Date  . ANTERIOR CRUCIATE LIGAMENT REPAIR    . TONSILECTOMY, ADENOIDECTOMY, BILATERAL MYRINGOTOMY AND TUBES    . VASECTOMY      There were no vitals filed for this visit.  Subjective Assessment - 02/28/18 1353    Subjective  "doing good, about to normal"    Currently in Pain?  No/denies                       [facility] Adult PT Treatment/Exercise - 02/28/18 0001      Exercises   Exercises  Lumbar      Lumbar Exercises: Aerobic   Stationary Bike  L3 7 min      Lumbar Exercises: Machines for Strengthening   Leg Press  180 2x10    Other Lumbar Machine Exercise  Rows 75 2x10 and lats 55lb 2x10       Lumbar Exercises: Standing   Other Standing Lumbar Exercises  walking sportscord 30 lbs all directions, Forward step up 2x10     Other Standing Lumbar Exercises   Hip 3 way (red Tband)      Lumbar Exercises: Supine   Bridge  Compliant;20 reps;2 seconds    Bridge with Ball Squeeze  Compliant;20 reps;3 seconds               PT Short Term Goals - 02/25/18 0830      PT SHORT TERM GOAL #1   Title  Independent with stretching HEP    Status  Achieved        PT Long Term Goals -  02/28/18 1440      PT LONG TERM GOAL #1   Title  Pt to report decreased pain in the low back by 75% or more with ADLS    Time  8    Period  Weeks    Status  Achieved      PT LONG TERM GOAL #2   Title  Patient able to drive to work with 03% less pain and stiffness afterwards.    Baseline  very tight after driving    Time  8    Period  Weeks    Status  Partially Met            Plan - 02/28/18 1436    Clinical Impression Statement  Pt came to clinic 8 min late today. Pt required min VC to take exercises slow & controlled. Pt demonstrated good body machanics with all exercises and insisted on going up in weight. Pt required rest break x1 with leg press  due to breathing. Pt stated he did not use is inhaler earlier but stated that it was normal. Pt presented as a little impulsive today and changed the weightt by himself a couple of times. Pt required close supervision with sportscord ambulation to ensure safety.     Rehab Potential  Excellent    PT Frequency  2x / week    PT Duration  8 weeks    PT Treatment/Interventions  ADLs/Self Care Home Management;Cryotherapy;Moist Heat;Traction;Ultrasound;Therapeutic activities;Therapeutic exercise;Neuromuscular re-education;Manual techniques;Dry needling    PT Next Visit Plan  Continue progressing as pain allows    PT Home Exercise Plan  LTR, SKTC, HS and piriformis stretches    Consulted and Agree with Plan of Care  Patient       Patient will benefit from skilled therapeutic intervention in order to improve the following deficits and impairments:  Pain, Decreased mobility, Postural dysfunction, Decreased range of motion, Impaired flexibility  Visit Diagnosis: Acute bilateral low back pain without sciatica     Problem List Patient Active Problem List   Diagnosis Date Noted  . Vitamin B12 deficiency 02/03/2018  . Anxiety and depression 05/17/2017  . Polyarthritis 12/28/2016  . Erectile dysfunction 12/18/2015  . Postprandial bloating  06/28/2015  . HTN (hypertension), benign 02/08/2015  . Insomnia 02/08/2015  . Dysuria 12/14/2014  . Atypical nevus of back 06/27/2014  . GERD (gastroesophageal reflux disease) 02/20/2014  . Pain in joint, ankle and foot 11/08/2013  . Low testosterone 04/24/2013  . Routine general medical examination at a health care facility 04/24/2013  . Obesity (BMI 30-39.9) 04/24/2013  . Decreased libido 10/31/2012  . Testicular pain 09/29/2011  . Asthma 03/12/2011  . NEOPLASM, SKIN, UNCERTAIN BEHAVIOR 99/37/1696  . FATIGUE 02/03/2010  . HEADACHE 06/08/2007  . GOUT 05/24/2007  . ALLERGIC RHINITIS 10/19/2006    Howell Rucks, SPTA 02/28/2018, 2:41 PM  [address] [place], [place], [address] Phone: [phone]   Fax:  [phone]  Name: [name]. MRN: [id number] Date of Birth: January 18, 1966

## 2018-03-04 ENCOUNTER — Ambulatory Visit: Payer: BLUE CROSS/BLUE SHIELD | Admitting: Physical Therapy

## 2018-03-04 ENCOUNTER — Encounter: Payer: Self-pay | Admitting: Physical Therapy

## 2018-03-04 DIAGNOSIS — M545 Low back pain, unspecified: Secondary | ICD-10-CM

## 2018-03-04 NOTE — Therapy (Signed)
[address] [place], [place], [address] Phone: [phone]   Fax:  [phone]  Physical Therapy Treatment  Patient Details  Name: [name]. MRN: [id number] Date of Birth: 03/21/1966 Referring Provider (PT): Melina Schools   Encounter Date: 03/04/2018  PT End of Session - 03/04/18 1005    Visit Number  4    Date for PT Re-Evaluation  04/18/18    PT Start Time  0930    PT Stop Time  1010    PT Time Calculation (min)  40 min    Activity Tolerance  Patient tolerated treatment well    Behavior During Therapy  [facility] for tasks assessed/performed       Past Medical History:  Diagnosis Date  . Asthma   . Gout   . H/O vasectomy   . Hypertension     Past Surgical History:  Procedure Laterality Date  . ANTERIOR CRUCIATE LIGAMENT REPAIR    . TONSILECTOMY, ADENOIDECTOMY, BILATERAL MYRINGOTOMY AND TUBES    . VASECTOMY      There were no vitals filed for this visit.  Subjective Assessment - 03/04/18 0930    Subjective  "Hurting yesterday" "I was tired, wife has been sick this week" "Had to take her to [place]" "Had to stay in a hotel room"    Currently in Pain?  No/denies    Pain Score  0-No pain                       [facility] Adult PT Treatment/Exercise - 03/04/18 0001      Lumbar Exercises: Aerobic   UBE (Upper Arm Bike)  L2.5 3 min each way     Nustep  LE only L5 x 4 minm       Lumbar Exercises: Machines for Strengthening   Cybex Knee Extension  35lb 2x10    Cybex Knee Flexion  65lb 2x10     Leg Press  160 3x10    Other Lumbar Machine Exercise  Rows 75 2x10 and lats 55lb 2x10       Lumbar Exercises: Standing   Heel Raises  20 reps;2 seconds   x2     Lumbar Exercises: Supine   Bridge  Compliant;20 reps;2 seconds    Bridge with [employer]  Compliant;20 reps;3 seconds    Other Supine Lumbar Exercises  LE on pball small bridges, K2C                PT Short  Term Goals - 02/25/18 0830      PT SHORT TERM GOAL #1   Title  Independent with stretching HEP    Status  Achieved        PT Long Term Goals - 03/04/18 1006      PT LONG TERM GOAL #1   Title  Pt to report decreased pain in the low back by 75% or more with ADLS    Status  Achieved      PT LONG TERM GOAL #2   Title  Patient able to drive to work with 95% less pain and stiffness afterwards.    Status  Partially Met            Plan - 03/04/18 1008    Clinical Impression Statement  Pt continues to be impulsive at times adding weight to machines. Pt has good strength and ROM on all machines, cues to slow down and control the muscle contraction.  Pt with some instability supine with LE on pball.      Rehab Potential  Excellent    PT Frequency  2x / week    PT Duration  8 weeks    PT Treatment/Interventions  ADLs/Self Care Home Management;Cryotherapy;Moist Heat;Traction;Ultrasound;Therapeutic activities;Therapeutic exercise;Neuromuscular re-education;Manual techniques;Dry needling    PT Next Visit Plan  Continue progressing as pain allows, Core stability        Patient will benefit from skilled therapeutic intervention in order to improve the following deficits and impairments:  Pain, Decreased mobility, Postural dysfunction, Decreased range of motion, Impaired flexibility  Visit Diagnosis: Acute bilateral low back pain without sciatica     Problem List Patient Active Problem List   Diagnosis Date Noted  . Vitamin B12 deficiency 02/03/2018  . Anxiety and depression 05/17/2017  . Polyarthritis 12/28/2016  . Erectile dysfunction 12/18/2015  . Postprandial bloating 06/28/2015  . HTN (hypertension), benign 02/08/2015  . Insomnia 02/08/2015  . Dysuria 12/14/2014  . Atypical nevus of back 06/27/2014  . GERD (gastroesophageal reflux disease) 02/20/2014  . Pain in joint, ankle and foot 11/08/2013  . Low testosterone 04/24/2013  . Routine general medical examination at a  health care facility 04/24/2013  . Obesity (BMI 30-39.9) 04/24/2013  . Decreased libido 10/31/2012  . Testicular pain 09/29/2011  . Asthma 03/12/2011  . NEOPLASM, SKIN, UNCERTAIN BEHAVIOR 42/35/3614  . FATIGUE 02/03/2010  . HEADACHE 06/08/2007  . GOUT 05/24/2007  . ALLERGIC RHINITIS 10/19/2006    Scot Jun, PTA 03/04/2018, 10:14 AM  [address] [place], [place], [address] Phone: [phone]   Fax:  [phone]  Name: Chinonso Linker. MRN: [id number] Date of Birth: 04/14/1965

## 2018-03-07 ENCOUNTER — Ambulatory Visit: Payer: BLUE CROSS/BLUE SHIELD | Admitting: Physical Therapy

## 2018-03-07 DIAGNOSIS — M545 Low back pain, unspecified: Secondary | ICD-10-CM

## 2018-03-07 NOTE — Therapy (Signed)
[address] [place], [place], [address] Phone: [phone]   Fax:  [phone]  Physical Therapy Treatment  Patient Details  Name: [name]. MRN: [id number] Date of Birth: [birth year] Referring Provider (PT): Melina Schools   Encounter Date: 03/07/2018  PT End of Session - 03/07/18 1401    Visit Number  5    Date for PT Re-Evaluation  04/18/18    PT Start Time  1349    PT Stop Time  1433    PT Time Calculation (min)  44 min    Activity Tolerance  Patient tolerated treatment well    Behavior During Therapy  [facility] for tasks assessed/performed       Past Medical History:  Diagnosis Date  . Asthma   . Gout   . H/O vasectomy   . Hypertension     Past Surgical History:  Procedure Laterality Date  . ANTERIOR CRUCIATE LIGAMENT REPAIR    . TONSILECTOMY, ADENOIDECTOMY, BILATERAL MYRINGOTOMY AND TUBES    . VASECTOMY      There were no vitals filed for this visit.  Subjective Assessment - 03/07/18 1356    Subjective  Patient states his back is not hurting just stiff, but that is better too.    Pertinent History  ACL surgery 1986, HTN    Diagnostic tests  xrays neg    Patient Stated Goals  to get rid of pain    Currently in Pain?  No/denies                       [facility] Adult PT Treatment/Exercise - 03/07/18 0001      Lumbar Exercises: Stretches   Other Lumbar Stretch Exercise  supine QL stretch 2 x 60 sec      Lumbar Exercises: Aerobic   UBE (Upper Arm Bike)  L 5  3 min each way     Nustep  LE only L7 x 2 min, L5 x 2 min      Lumbar Exercises: Seated   Other Seated Lumbar Exercises  on dyna disc x 20 flex/ext      Lumbar Exercises: Supine   Bridge  Compliant;5 seconds;15 reps   last five reps with HS curl (difficult)     Lumbar Exercises: Quadruped   Straight Leg Raise  20 reps    Opposite Arm/Leg Raise  Right arm/Left leg;Left arm/Right leg;20 reps   10 ea   Plank   plank x       Manual Therapy   Manual Therapy  Joint mobilization;Myofascial release    Joint Mobilization  UPA mobs to left lumbar Gd II/III    Myofascial Release  in SDLY to left QL               PT Short Term Goals - 02/25/18 0830      PT SHORT TERM GOAL #1   Title  Independent with stretching HEP    Status  Achieved        PT Long Term Goals - 03/04/18 1006      PT LONG TERM GOAL #1   Title  Pt to report decreased pain in the low back by 75% or more with ADLS    Status  Achieved      PT LONG TERM GOAL #2   Title  Patient able to drive to work with 38% less pain and stiffness afterwards.    Status  Partially Met  Plan - 03/07/18 1439    Clinical Impression Statement  Patient is progressing with goals. He did well with quadriped exercises in that he could neutralize his spine without VCs after the first few reps. He will benefit from continued strengthening in this position. He has a tight Left QL and responded well to manual and supine QL stretch.    PT Treatment/Interventions  ADLs/Self Care Home Management;Cryotherapy;Moist Heat;Traction;Ultrasound;Therapeutic activities;Therapeutic exercise;Neuromuscular re-education;Manual techniques;Dry needling    PT Next Visit Plan  Continue progressing Core stability; left QL flexibility       Patient will benefit from skilled therapeutic intervention in order to improve the following deficits and impairments:  Pain, Decreased mobility, Postural dysfunction, Decreased range of motion, Impaired flexibility  Visit Diagnosis: Acute bilateral low back pain without sciatica     Problem List Patient Active Problem List   Diagnosis Date Noted  . Vitamin B12 deficiency 02/03/2018  . Anxiety and depression 05/17/2017  . Polyarthritis 12/28/2016  . Erectile dysfunction 12/18/2015  . Postprandial bloating 06/28/2015  . HTN (hypertension), benign 02/08/2015  . Insomnia 02/08/2015  . Dysuria 12/14/2014  .  Atypical nevus of back 06/27/2014  . GERD (gastroesophageal reflux disease) 02/20/2014  . Pain in joint, ankle and foot 11/08/2013  . Low testosterone 04/24/2013  . Routine general medical examination at a health care facility 04/24/2013  . Obesity (BMI 30-39.9) 04/24/2013  . Decreased libido 10/31/2012  . Testicular pain 09/29/2011  . Asthma 03/12/2011  . NEOPLASM, SKIN, UNCERTAIN BEHAVIOR 35/09/5730  . FATIGUE 02/03/2010  . HEADACHE 06/08/2007  . GOUT 05/24/2007  . ALLERGIC RHINITIS 10/19/2006   Madelyn Flavors PT 03/07/2018, 2:43 PM  [address] [place], [place], [address] Phone: [phone]   Fax:  [phone]  Name: George Wheller. MRN: [id number] Date of Birth: Aug 06, 1965

## 2018-03-07 NOTE — Patient Instructions (Signed)
Side Stretch    Lie on your back with your shoulders to the right and hips to the left. Move your straight rightt leg to the right and then try to bring the left leg to meet it. Stop when you feel a stretch in the left low back. Hold 1-5 min. 1-2 x/day.  You DO NOT have to raise arm overhead as shown.

## 2018-03-11 ENCOUNTER — Ambulatory Visit: Payer: BLUE CROSS/BLUE SHIELD | Admitting: Family Medicine

## 2018-03-11 ENCOUNTER — Encounter: Payer: Self-pay | Admitting: Physical Therapy

## 2018-03-11 ENCOUNTER — Encounter: Payer: Self-pay | Admitting: Family Medicine

## 2018-03-11 ENCOUNTER — Other Ambulatory Visit: Payer: Self-pay

## 2018-03-11 ENCOUNTER — Ambulatory Visit: Payer: BLUE CROSS/BLUE SHIELD | Admitting: Physical Therapy

## 2018-03-11 VITALS — BP 123/80 | HR 84 | Temp 98.1°F | Resp 16 | Ht 70.0 in | Wt 245.0 lb

## 2018-03-11 DIAGNOSIS — E669 Obesity, unspecified: Secondary | ICD-10-CM

## 2018-03-11 DIAGNOSIS — I10 Essential (primary) hypertension: Secondary | ICD-10-CM

## 2018-03-11 DIAGNOSIS — K219 Gastro-esophageal reflux disease without esophagitis: Secondary | ICD-10-CM

## 2018-03-11 DIAGNOSIS — M545 Low back pain, unspecified: Secondary | ICD-10-CM

## 2018-03-11 DIAGNOSIS — M79672 Pain in left foot: Secondary | ICD-10-CM

## 2018-03-11 DIAGNOSIS — M79671 Pain in right foot: Secondary | ICD-10-CM

## 2018-03-11 MED ORDER — PANTOPRAZOLE SODIUM 40 MG PO TBEC: 40.0000 mg | DELAYED_RELEASE_TABLET | Freq: Every day | ORAL | 1 refills | Status: DC

## 2018-03-11 MED ORDER — PANTOPRAZOLE SODIUM 40 MG PO TBEC: 40.0000 mg | DELAYED_RELEASE_TABLET | Freq: Every day | ORAL | 3 refills | Status: DC

## 2018-03-11 NOTE — Progress Notes (Signed)
   Subjective:    Patient ID: George Needle., male    DOB: 06/18/1965, 52 y.o.   MRN: [id number]  HPI Foot pain- pt is down 9 lbs since last visit.  Tingling has improved since last visit.  Not taking B12 supplement- 'ive been busy'.  Pt completed his Voltaren prescription- which was not helpful but his 'back doctor' gave him steroids that helped considerably.  Pt feels his pain and tingling was shoe related.  Obesity- pt is down 9 lbs since last visit.  Is doing PT, exercising, and trying to eat better.  HTN- chronic problem.  BP is well controlled today.  Would like to stop HCTZ and monitor BP at home to determine if meds are needed.  GERD- pt reports sxs are worsening despite Omeprazole.   Review of Systems For ROS see HPI     Objective:   Physical Exam Vitals signs reviewed.  Constitutional:      General: He is not in acute distress.    Appearance: He is well-developed.     Comments: obese  HENT:     Head: Normocephalic and atraumatic.  Eyes:     Conjunctiva/sclera: Conjunctivae normal.     Pupils: Pupils are equal, round, and reactive to light.  Neck:     Musculoskeletal: Normal range of motion and neck supple.     Thyroid: No thyromegaly.  Cardiovascular:     Rate and Rhythm: Normal rate and regular rhythm.     Heart sounds: Normal heart sounds. No murmur.  Pulmonary:     Effort: Pulmonary effort is normal. No respiratory distress.     Breath sounds: Normal breath sounds.  Abdominal:     General: Bowel sounds are normal. There is no distension.     Palpations: Abdomen is soft.  Lymphadenopathy:     Cervical: No cervical adenopathy.  Skin:    General: Skin is warm and dry.  Neurological:     Mental Status: He is alert and oriented to person, place, and time.     Cranial Nerves: No cranial nerve deficit.  Psychiatric:        Behavior: Behavior normal.           Assessment & Plan:  Foot pain- bilateral

## 2018-03-11 NOTE — Assessment & Plan Note (Signed)
Ongoing issue for pt.  Has lost 9 lbs since last visit.  Applauded his efforts.  Will follow.

## 2018-03-11 NOTE — Patient Instructions (Signed)
Follow up in 6-8 weeks to recheck BP STOP the HCTZ daily SWITCH to Pantoprazole for acid reflux rather than Omeprazole Call with any questions or concerns Happy Holidays!

## 2018-03-11 NOTE — Therapy (Signed)
[address] [place], [place], [address] Phone: [phone]   Fax:  [phone]  Physical Therapy Treatment  Patient Details  Name: [name]. MRN: [id number] Date of Birth: 1965/04/21 Referring Provider (PT): Melina Schools   Encounter Date: 03/11/2018  PT End of Session - 03/11/18 0924    Visit Number  6    Date for PT Re-Evaluation  04/18/18    PT Start Time  0844    PT Stop Time  0927    PT Time Calculation (min)  43 min    Activity Tolerance  Patient tolerated treatment well    Behavior During Therapy  [facility] for tasks assessed/performed       Past Medical History:  Diagnosis Date  . Asthma   . Gout   . H/O vasectomy   . Hypertension     Past Surgical History:  Procedure Laterality Date  . ANTERIOR CRUCIATE LIGAMENT REPAIR    . TONSILECTOMY, ADENOIDECTOMY, BILATERAL MYRINGOTOMY AND TUBES    . VASECTOMY      There were no vitals filed for this visit.  Subjective Assessment - 03/11/18 0848    Subjective  "I did those tennis ball things she wanted me to do"     Currently in Pain?  No/denies                       [facility] Adult PT Treatment/Exercise - 03/11/18 0001      Lumbar Exercises: Aerobic   UBE (Upper Arm Bike)  L5  2 min each way     Nustep  L5 x 6 min       Lumbar Exercises: Machines for Strengthening   Leg Press  160 3x10    Other Lumbar Machine Exercise  Rows 75 2x10 and lats 55lb 2x10       Lumbar Exercises: Standing   Other Standing Lumbar Exercises  15lb  20 lb 2x10     Other Standing Lumbar Exercises  10lb dumbbells explosive step up x10 each      Lumbar Exercises: Seated   Sit to Stand  20 reps   blur ball with OHP    Other Seated Lumbar Exercises  AR press 35lb 2x10       Lumbar Exercises: Supine   Bridge  2 seconds;20 reps    Single Leg Bridge  10 reps;Compliant;2 seconds    Straight Leg Raise  2 seconds;15 reps               PT  Short Term Goals - 02/25/18 0830      PT SHORT TERM GOAL #1   Title  Independent with stretching HEP    Status  Achieved        PT Long Term Goals - 03/11/18 0919      PT LONG TERM GOAL #1   Title  Pt to report decreased pain in the low back by 75% or more with ADLS    Status  Achieved      PT LONG TERM GOAL #2   Title  Patient able to drive to work with 58% less pain and stiffness afterwards.    Status  Partially Met      PT LONG TERM GOAL #3   Title  Pt able to demonstrate correct body mechanics for lifting to avoid further injury    Status  Partially Met  Plan - 03/11/18 0925    Clinical Impression Statement  Progressing well, reports little to no pain with ADL's or at work. He did with both functional and machine strength training. LE tightness noted. Fatigue quick with SL bridges.    Rehab Potential  Excellent    PT Frequency  2x / week    PT Duration  8 weeks    PT Treatment/Interventions  ADLs/Self Care Home Management;Cryotherapy;Moist Heat;Traction;Ultrasound;Therapeutic activities;Therapeutic exercise;Neuromuscular re-education;Manual techniques;Dry needling    PT Next Visit Plan  Continue progressing Core stability; left QL flexibility       Patient will benefit from skilled therapeutic intervention in order to improve the following deficits and impairments:  Pain, Decreased mobility, Postural dysfunction, Decreased range of motion, Impaired flexibility  Visit Diagnosis: Acute bilateral low back pain without sciatica     Problem List Patient Active Problem List   Diagnosis Date Noted  . Vitamin B12 deficiency 02/03/2018  . Anxiety and depression 05/17/2017  . Polyarthritis 12/28/2016  . Erectile dysfunction 12/18/2015  . Postprandial bloating 06/28/2015  . HTN (hypertension), benign 02/08/2015  . Insomnia 02/08/2015  . Dysuria 12/14/2014  . Atypical nevus of back 06/27/2014  . GERD (gastroesophageal reflux disease) 02/20/2014  . Pain  in joint, ankle and foot 11/08/2013  . Low testosterone 04/24/2013  . Routine general medical examination at a health care facility 04/24/2013  . Obesity (BMI 30-39.9) 04/24/2013  . Decreased libido 10/31/2012  . Testicular pain 09/29/2011  . Asthma 03/12/2011  . NEOPLASM, SKIN, UNCERTAIN BEHAVIOR 75/12/2583  . FATIGUE 02/03/2010  . HEADACHE 06/08/2007  . GOUT 05/24/2007  . ALLERGIC RHINITIS 10/19/2006    Scot Jun, PTA 03/11/2018, 9:28 AM  [address] [place], [place], [address] Phone: [phone]   Fax:  [phone]  Name: [name]. MRN: [id number] Date of Birth: 23-Feb-1966

## 2018-03-11 NOTE — Assessment & Plan Note (Signed)
Chronic problem.  Currently well controlled.  Would like a trial off medication and see if BP remains at goal.  Will monitor closely.

## 2018-03-11 NOTE — Assessment & Plan Note (Signed)
Deteriorated.  Switch Omeprazole to Protonix.

## 2018-03-14 ENCOUNTER — Ambulatory Visit: Payer: BLUE CROSS/BLUE SHIELD | Admitting: Physical Therapy

## 2018-03-18 ENCOUNTER — Ambulatory Visit: Payer: BLUE CROSS/BLUE SHIELD | Admitting: Physical Therapy

## 2018-03-18 ENCOUNTER — Encounter: Payer: Self-pay | Admitting: Physical Therapy

## 2018-03-18 DIAGNOSIS — M545 Low back pain, unspecified: Secondary | ICD-10-CM

## 2018-03-18 NOTE — Therapy (Signed)
[address] [place], [place], [address] Phone: [phone]   Fax:  [phone]  Physical Therapy Treatment  Patient Details  Name: Freddrick Gladson. MRN: [id number] Date of Birth: 04/13/1965 Referring Provider (PT): Melina Schools   Encounter Date: 03/18/2018  PT End of Session - 03/18/18 0924    Visit Number  7    PT Start Time  0840    PT Stop Time  0925    PT Time Calculation (min)  45 min    Activity Tolerance  Patient tolerated treatment well    Behavior During Therapy  [facility] for tasks assessed/performed       Past Medical History:  Diagnosis Date  . Asthma   . Gout   . H/O vasectomy   . Hypertension     Past Surgical History:  Procedure Laterality Date  . ANTERIOR CRUCIATE LIGAMENT REPAIR    . TONSILECTOMY, ADENOIDECTOMY, BILATERAL MYRINGOTOMY AND TUBES    . VASECTOMY      There were no vitals filed for this visit.  Subjective Assessment - 03/18/18 0846    Subjective  Patient reports that he is doing better and will stop PT today due to changing inxurances.  Reports most pain when he is fatigued    Currently in Pain?  No/denies    Aggravating Factors   bending                       [facility] Adult PT Treatment/Exercise - 03/18/18 0001      Self-Care   Self-Care  ADL's;Lifting;Other Self-Care Comments    ADL's  laundry, prolonged sitting/standing    Lifting  golfers, no twisting, reaching    Other Self-Care Comments   went over return to a gym setting, things to do, avoid, reps, went over stretches and the importance of them, talked with him about yoga      Lumbar Exercises: Stretches   Passive Hamstring Stretch  4 reps;20 seconds    Lower Trunk Rotation  4 reps;10 seconds    Quad Stretch  4 reps;10 seconds    Piriformis Stretch  4 reps;20 seconds    Other Lumbar Stretch Exercise  supine QL stretch 2 x 60 sec      Lumbar Exercises: Aerobic   Nustep  L5 x 6 min                 PT Short Term Goals - 02/25/18 0830      PT SHORT TERM GOAL #1   Title  Independent with stretching HEP    Status  Achieved        PT Long Term Goals - 03/18/18 7001      PT LONG TERM GOAL #1   Title  Pt to report decreased pain in the low back by 75% or more with ADLS    Status  Achieved      PT LONG TERM GOAL #2   Title  Patient able to drive to work with 74% less pain and stiffness afterwards.    Status  Achieved      PT LONG TERM GOAL #3   Title  Pt able to demonstrate correct body mechanics for lifting to avoid further injury    Status  Achieved            Plan - 03/18/18 0926    Clinical Impression Statement  Patient remains very tight in the HS and quads.  He will be changing insurances so this is his last visit with [place].  We had a good discussion of what he needs to do and how imprtant it is that he start stretching and going to the gym for his health    PT Next Visit Plan  discharg, goals met    Consulted and Agree with Plan of Care  Patient       Patient will benefit from skilled therapeutic intervention in order to improve the following deficits and impairments:  Pain, Decreased mobility, Postural dysfunction, Decreased range of motion, Impaired flexibility  Visit Diagnosis: Acute bilateral low back pain without sciatica     Problem List Patient Active Problem List   Diagnosis Date Noted  . Vitamin B12 deficiency 02/03/2018  . Anxiety and depression 05/17/2017  . Polyarthritis 12/28/2016  . Erectile dysfunction 12/18/2015  . Postprandial bloating 06/28/2015  . HTN (hypertension), benign 02/08/2015  . Insomnia 02/08/2015  . Atypical nevus of back 06/27/2014  . GERD (gastroesophageal reflux disease) 02/20/2014  . Pain in joint, ankle and foot 11/08/2013  . Low testosterone 04/24/2013  . Routine general medical examination at a health care facility 04/24/2013  . Obesity (BMI 30-39.9) 04/24/2013  . Decreased libido 10/31/2012   . Asthma 03/12/2011  . NEOPLASM, SKIN, UNCERTAIN BEHAVIOR 88/33/7445  . GOUT 05/24/2007  . ALLERGIC RHINITIS 10/19/2006    Sumner Boast., PT 03/18/2018, 9:28 AM  [facility] [address] [place], [place], [address] Phone: [phone]   Fax:  [phone]  Name: Divon Krabill. MRN: [id number] Date of Birth: September 02, 1965

## 2018-03-22 ENCOUNTER — Ambulatory Visit: Payer: BLUE CROSS/BLUE SHIELD | Admitting: Physical Therapy

## 2018-04-08 ENCOUNTER — Telehealth: Payer: Self-pay | Admitting: Family Medicine

## 2018-04-08 NOTE — Telephone Encounter (Signed)
[name] called and made pt aware forms are ready for p/u.

## 2018-04-08 NOTE — Telephone Encounter (Signed)
Form completed and placed in basket  

## 2018-04-08 NOTE — Telephone Encounter (Signed)
Pt dropped off forms from his employer stating that the letter he received from [employer] was not good enough for him to return to work and asked if these could be filled out. Pt asked that we call him when ready to p/u and is hoping that could be today. Giving forms to [employer] to complete.

## 2018-05-06 ENCOUNTER — Encounter: Payer: Self-pay | Admitting: Family Medicine

## 2018-05-18 ENCOUNTER — Emergency Department (HOSPITAL_BASED_OUTPATIENT_CLINIC_OR_DEPARTMENT_OTHER)
Admission: EM | Admit: 2018-05-18 | Discharge: 2018-05-18 | Discharge status: Against Medical Advice | Payer: Self-pay | Attending: Emergency Medicine | Admitting: Emergency Medicine

## 2018-05-18 ENCOUNTER — Other Ambulatory Visit: Payer: Self-pay

## 2018-05-18 ENCOUNTER — Encounter (HOSPITAL_BASED_OUTPATIENT_CLINIC_OR_DEPARTMENT_OTHER): Payer: Self-pay | Admitting: *Deleted

## 2018-05-18 DIAGNOSIS — I10 Essential (primary) hypertension: Secondary | ICD-10-CM | POA: Insufficient documentation

## 2018-05-18 DIAGNOSIS — Z5321 Procedure and treatment not carried out due to patient leaving prior to being seen by health care provider: Secondary | ICD-10-CM | POA: Insufficient documentation

## 2018-05-18 NOTE — ED Triage Notes (Signed)
Pt c/o increased bp X 3 days also c/o h/a

## 2018-05-18 NOTE — ED Notes (Signed)
Pt eloped, not found in treatment room.

## 2018-05-28 IMAGING — US US CHEST/MEDIASTINUM
1 series · 14 of 16 positions shown · non-contrast
Comparison: Chest x-ray 02/12/2014.

CLINICAL DATA: Palpable area right chest.

EXAM:
CHEST ULTRASOUND

[Series 1: us chest/mediastinum · 0.07mm/px · 21 acquisitions, 14 frames shown]
[im 1/21]
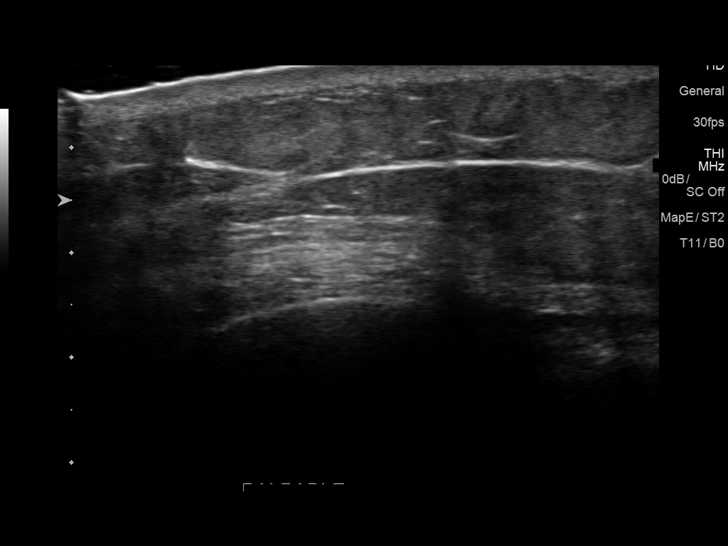
[im 2/21]
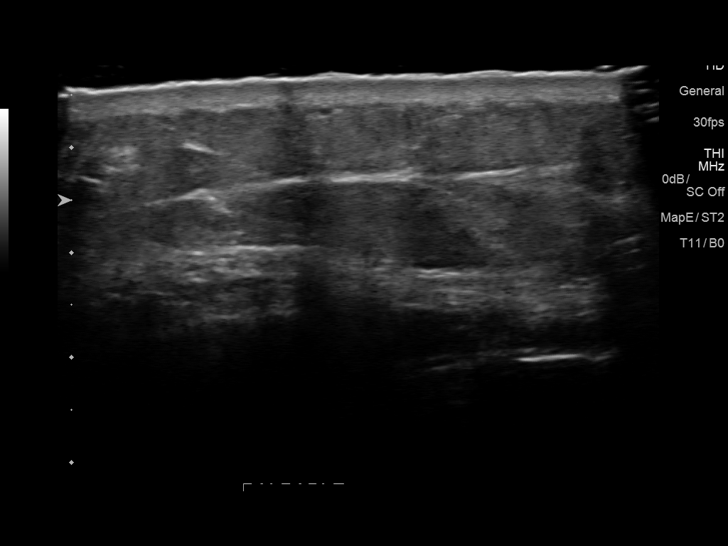
[im 3/21]
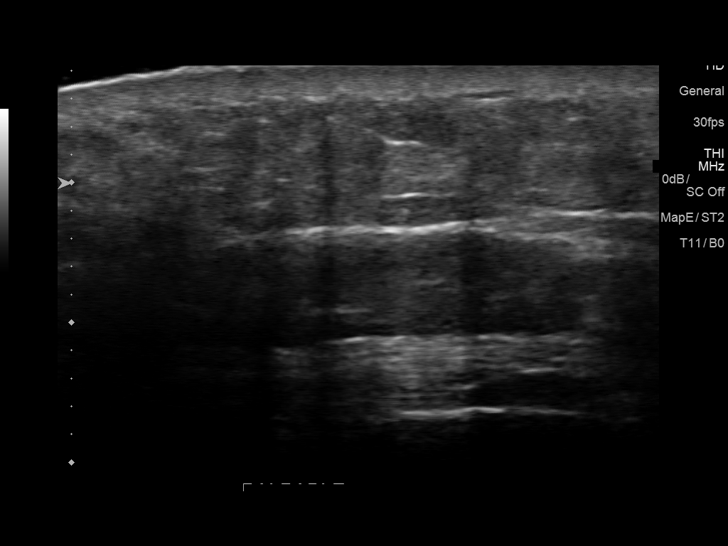
[im 6/21]
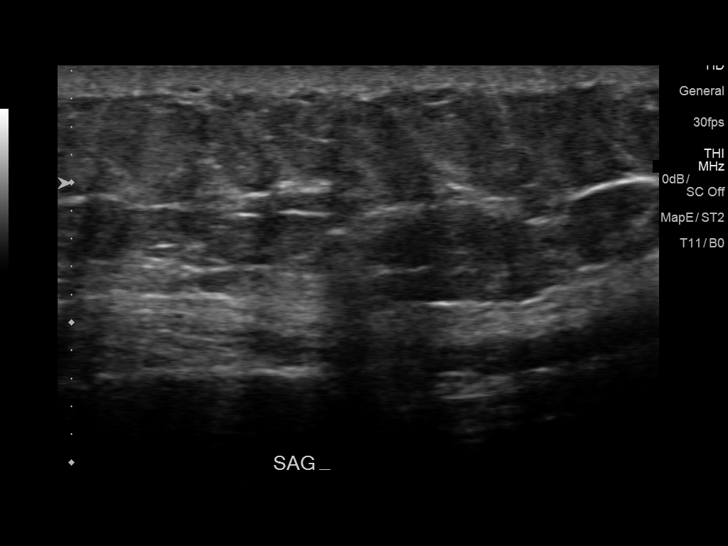
[im 7/21]
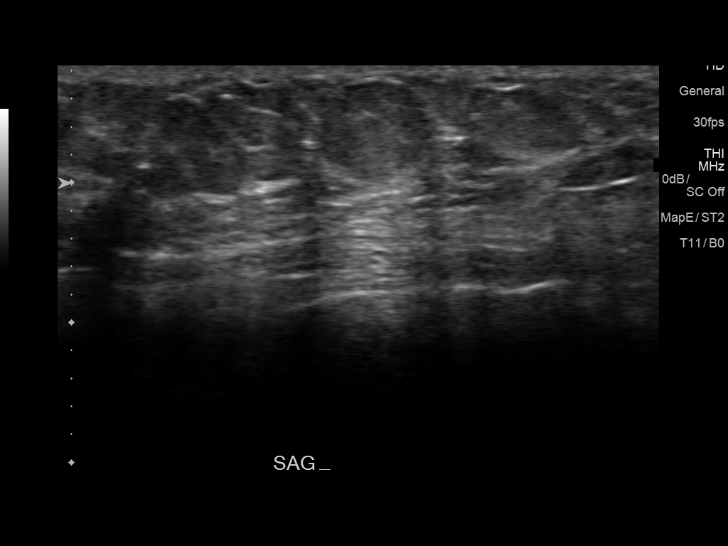
[im 9/21]
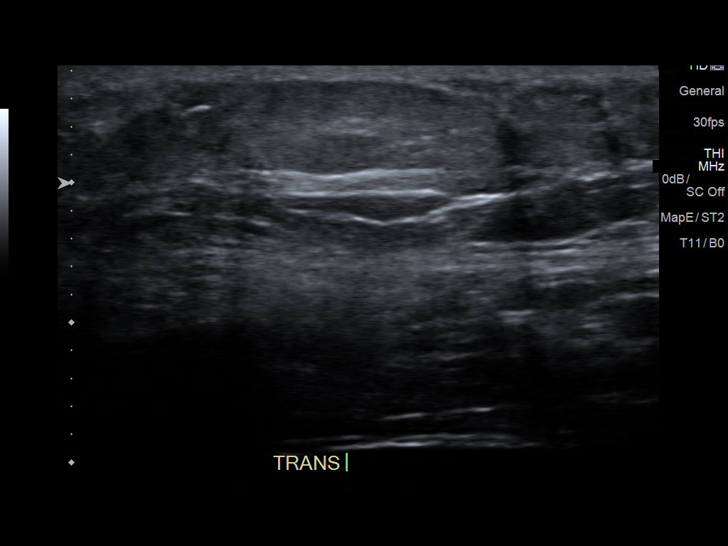
[im 10/21]
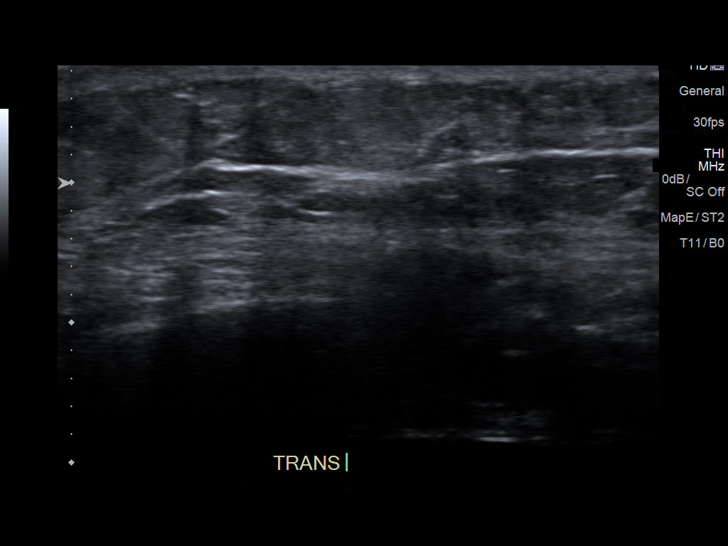
[im 11/21]
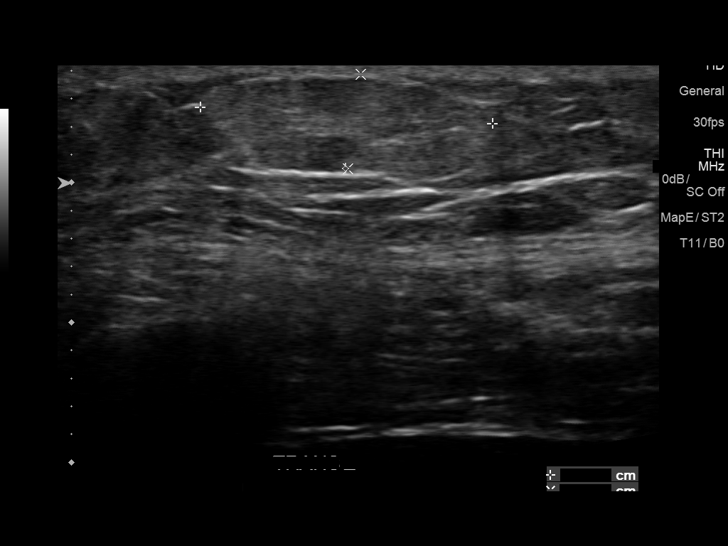
[im 13/21]
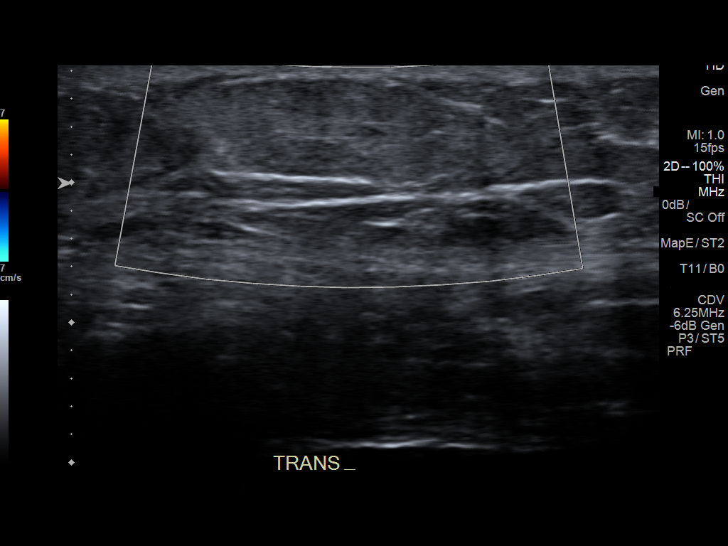
[im 14/21]
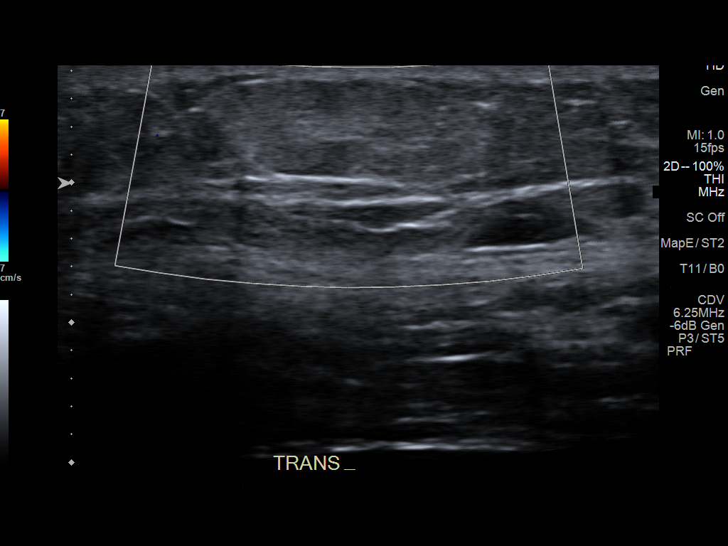
[im 17/21]
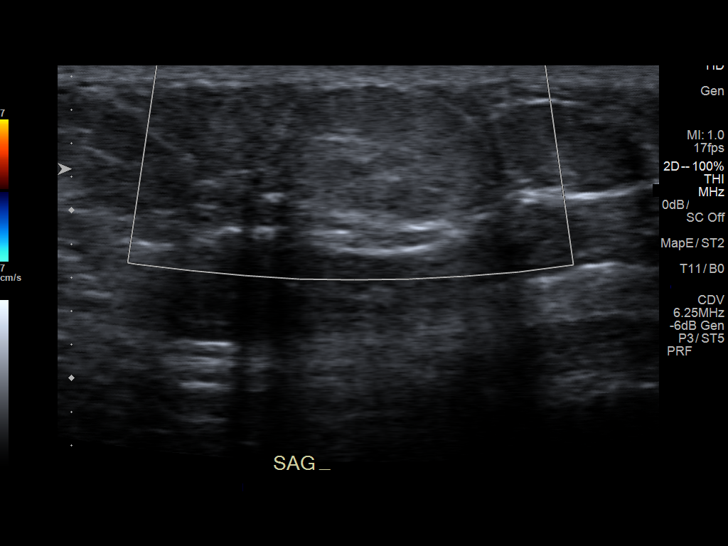
[im 18/21]
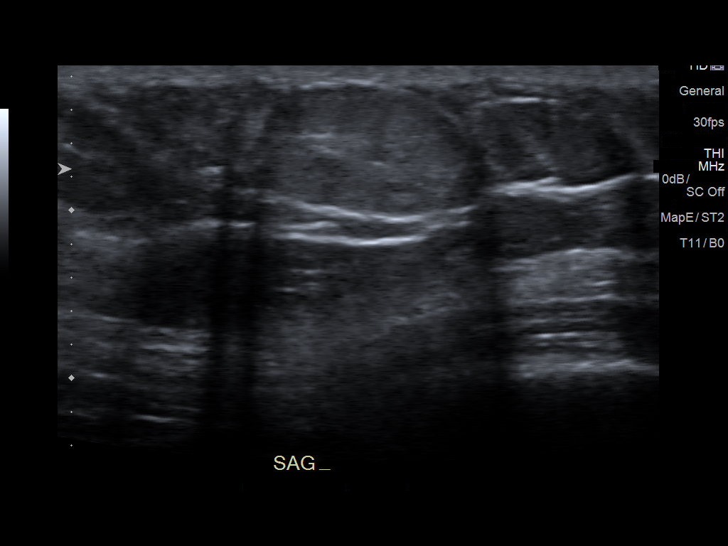
[im 19/21]
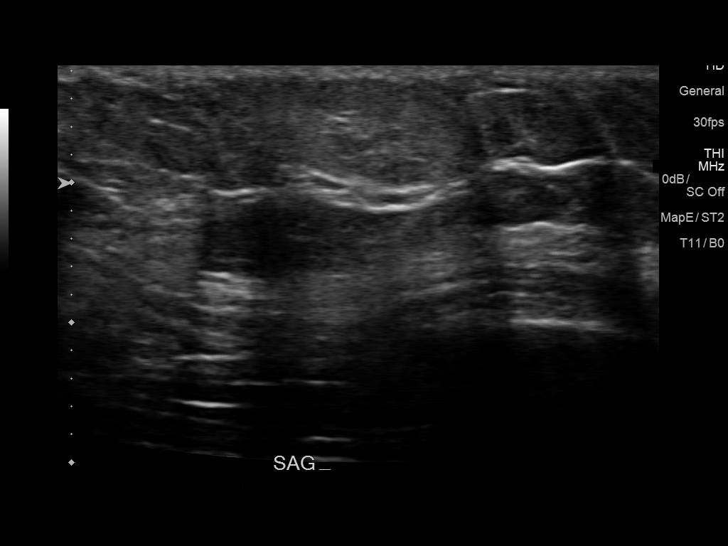
[im 21/21]
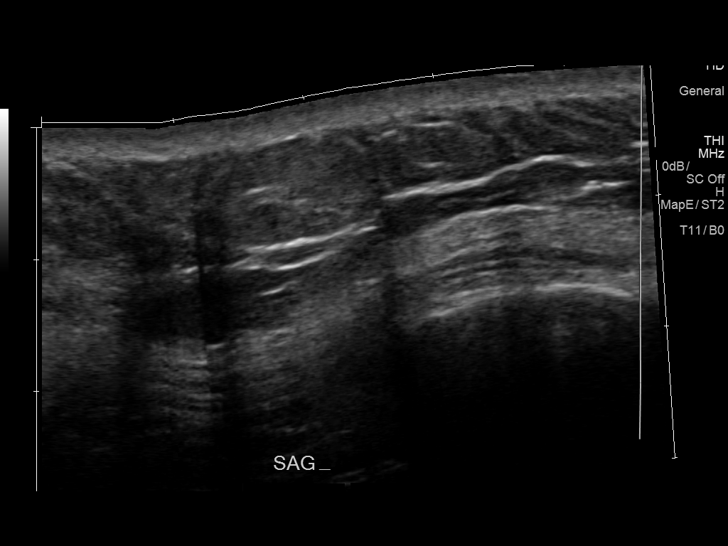

[14 of 16 positions shown; findings below may reference images not displayed]

FINDINGS: A 2.1 x 0.7 x 1.3 cm solid mass is noted in the region of the
palpable area in the right posterior chest. The mass is slightly
hyperechoic and may represent a lipoma. Confirmation with
gadolinium-enhanced MRI can be obtained. No significant blood flow
noted.
IMPRESSION: 2.1 x 0.7 x 1.3 cm hyperechoic mass in the region of palpable
abnormality right posterior chest, most likely a lipoma.

## 2018-06-20 ENCOUNTER — Other Ambulatory Visit: Payer: Self-pay

## 2018-06-20 ENCOUNTER — Encounter (HOSPITAL_COMMUNITY): Payer: Self-pay | Admitting: Emergency Medicine

## 2018-06-20 ENCOUNTER — Emergency Department (HOSPITAL_COMMUNITY): Payer: BLUE CROSS/BLUE SHIELD

## 2018-06-20 ENCOUNTER — Emergency Department (HOSPITAL_COMMUNITY)
Admission: EM | Admit: 2018-06-20 | Discharge: 2018-06-20 | Disposition: A | Discharge status: Home / Self Care | Payer: BLUE CROSS/BLUE SHIELD | Attending: Emergency Medicine | Admitting: Emergency Medicine

## 2018-06-20 ENCOUNTER — Encounter: Payer: Self-pay | Admitting: Family Medicine

## 2018-06-20 DIAGNOSIS — I10 Essential (primary) hypertension: Secondary | ICD-10-CM | POA: Diagnosis not present

## 2018-06-20 DIAGNOSIS — E876 Hypokalemia: Secondary | ICD-10-CM | POA: Diagnosis not present

## 2018-06-20 DIAGNOSIS — J45909 Unspecified asthma, uncomplicated: Secondary | ICD-10-CM | POA: Diagnosis not present

## 2018-06-20 DIAGNOSIS — Z79899 Other long term (current) drug therapy: Secondary | ICD-10-CM | POA: Diagnosis not present

## 2018-06-20 DIAGNOSIS — G40909 Epilepsy, unspecified, not intractable, without status epilepticus: Secondary | ICD-10-CM | POA: Diagnosis present

## 2018-06-20 DIAGNOSIS — R569 Unspecified convulsions: Secondary | ICD-10-CM

## 2018-06-20 LAB — CBC WITH DIFFERENTIAL/PLATELET
ABS IMMATURE GRANULOCYTES: 0.07 10*3/uL (ref 0.00–0.07)
Basophils Absolute: 0 10*3/uL (ref 0.0–0.1)
Basophils Relative: 0 %
Eosinophils Absolute: 0 10*3/uL (ref 0.0–0.5)
Eosinophils Relative: 0 %
HCT: 42.8 % (ref 39.0–52.0)
Hemoglobin: 13.1 g/dL (ref 13.0–17.0)
Immature Granulocytes: 1 %
Lymphocytes Relative: 17 %
Lymphs Abs: 1.3 10*3/uL (ref 0.7–4.0)
MCH: 25.4 pg — ABNORMAL LOW (ref 26.0–34.0)
MCHC: 30.6 g/dL (ref 30.0–36.0)
MCV: 83.1 fL (ref 80.0–100.0)
Monocytes Absolute: 0.6 10*3/uL (ref 0.1–1.0)
Monocytes Relative: 7 %
Neutro Abs: 5.6 10*3/uL (ref 1.7–7.7)
Neutrophils Relative %: 75 %
Platelets: 188 10*3/uL (ref 150–400)
RBC: 5.15 MIL/uL (ref 4.22–5.81)
RDW: 14.3 % (ref 11.5–15.5)
WBC: 7.5 10*3/uL (ref 4.0–10.5)
nRBC: 0 % (ref 0.0–0.2)

## 2018-06-20 LAB — BASIC METABOLIC PANEL
Anion gap: 6 (ref 5–15)
BUN: 13 mg/dL (ref 6–20)
CO2: 23 mmol/L (ref 22–32)
Calcium: 8 mg/dL — ABNORMAL LOW (ref 8.9–10.3)
Chloride: 110 mmol/L (ref 98–111)
Creatinine, Ser: 0.88 mg/dL (ref 0.61–1.24)
GFR calc Af Amer: >60 mL/min (ref >60)
GFR calc non Af Amer: >60 mL/min (ref >60)
Glucose, Bld: 87 mg/dL (ref 70–99)
POTASSIUM: 3.3 mmol/L — AB (ref 3.5–5.1)
Sodium: 139 mmol/L (ref 135–145)

## 2018-06-20 LAB — RAPID URINE DRUG SCREEN, HOSP PERFORMED
Amphetamines: NOT DETECTED
Barbiturates: NOT DETECTED
Benzodiazepines: NOT DETECTED
Cocaine: NOT DETECTED
Opiates: NOT DETECTED
Tetrahydrocannabinol: NOT DETECTED

## 2018-06-20 LAB — URINALYSIS, ROUTINE W REFLEX MICROSCOPIC
Bilirubin Urine: NEGATIVE
Glucose, UA: NEGATIVE mg/dL
Hgb urine dipstick: NEGATIVE
Ketones, ur: NEGATIVE mg/dL
Leukocytes,Ua: NEGATIVE
Nitrite: NEGATIVE
PROTEIN: NEGATIVE mg/dL
Specific Gravity, Urine: 1.011 (ref 1.005–1.030)
pH: 6 (ref 5.0–8.0)

## 2018-06-20 LAB — MAGNESIUM: MAGNESIUM: 1.9 mg/dL (ref 1.7–2.4)

## 2018-06-20 MED ORDER — POTASSIUM CHLORIDE CRYS ER 20 MEQ PO TBCR: 20.0000 meq | EXTENDED_RELEASE_TABLET | Freq: Every day | ORAL | 0 refills | Status: DC

## 2018-06-20 MED ORDER — POTASSIUM CHLORIDE CRYS ER 20 MEQ PO TBCR
40.0000 meq | EXTENDED_RELEASE_TABLET | Freq: Once | ORAL | Status: AC
  Administered 2018-06-20: 40 meq via ORAL
  Filled 2018-06-20: qty 2

## 2018-06-20 MED ORDER — ACETAMINOPHEN 325 MG PO TABS
650.0000 mg | ORAL_TABLET | Freq: Once | ORAL | Status: AC
  Administered 2018-06-20: 650 mg via ORAL
  Filled 2018-06-20: qty 2

## 2018-06-20 NOTE — ED Notes (Signed)
Patient verbalizes understanding of discharge instructions. Opportunity for questioning and answers were provided. Armband removed by staff, pt discharged from ED.  

## 2018-06-20 NOTE — ED Triage Notes (Signed)
Pt arrives to ED from home with complaints of seizure activity this afternoon. EMS reports pt was doing yard work and suddenly fell and had a grand-mal like seizure with stiffness and jerking that lasted 1 minute that was witnessed by his sons. Pt was confused up to 25 minutes after the event and does not remember the seizure but remembers what led up to it. Pt works third shift and worked last night, pt stated that he came home and only slept for a couple of hors before getting back up. No injures noted to pt.

## 2018-06-20 NOTE — Discharge Instructions (Signed)
Given the possible seizure, you are not allowed to drive until cleared by your neurologist.  It is very important to follow-up with a neurologist.  If you have another seizure or concern for seizure, return to the ER for evaluation.  If you develop severe headache, vomiting, blurry vision, fever, neck stiffness, or any other new/concerning symptoms then return to the ER for evaluation.  Do not do any activities that could place you at risk if you were to have another seizure such as climbing a ladder, being in a bathtub by herself or swimming in a pool by herself, etc.

## 2018-06-20 NOTE — ED Provider Notes (Signed)
[facility] EMERGENCY DEPARTMENT Provider Note   CSN: [id number] Arrival date & time: 06/20/18  1256    History   Chief Complaint Chief Complaint  Patient presents with  . Seizures    HPI Rafan Sanders. is a 53 y.o. male.     HPI  53 year old male presents with seizure like activity.  The patient was mowing his lawn and he bent down to pull a stick out of the ground. He remembers was waking up in the ambulance.  EMS reports that he had grand mall seizure with stiffness and jerking for about 1 minute, witnessed by his sons.  He was postictal/confused for up to 25 minutes.  The patient works third shift like he has for the last 4 years and states he only got about 2 or 3 hours of sleep this morning.  This is not atypical for him on his days off.  Currently he has a little bit of left jaw pain that started after the episode but feels fine otherwise.  No fever, cough, shortness of breath, headache or weakness/numbness.  No prior history of seizures.  No illicit drug use or frequent alcohol use.  Past Medical History:  Diagnosis Date  . Asthma   . Gout   . H/O vasectomy   . Hypertension     Patient Active Problem List   Diagnosis Date Noted  . Vitamin B12 deficiency 02/03/2018  . Anxiety and depression 05/17/2017  . Polyarthritis 12/28/2016  . Erectile dysfunction 12/18/2015  . Postprandial bloating 06/28/2015  . HTN (hypertension), benign 02/08/2015  . Insomnia 02/08/2015  . Atypical nevus of back 06/27/2014  . GERD (gastroesophageal reflux disease) 02/20/2014  . Pain in joint, ankle and foot 11/08/2013  . Low testosterone 04/24/2013  . Routine general medical examination at a health care facility 04/24/2013  . Obesity (BMI 30-39.9) 04/24/2013  . Decreased libido 10/31/2012  . Asthma 03/12/2011  . NEOPLASM, SKIN, UNCERTAIN BEHAVIOR 93/81/0175  . GOUT 05/24/2007  . ALLERGIC RHINITIS 10/19/2006    Past Surgical History:  Procedure Laterality Date   . ANTERIOR CRUCIATE LIGAMENT REPAIR    . TONSILECTOMY, ADENOIDECTOMY, BILATERAL MYRINGOTOMY AND TUBES    . VASECTOMY          Home Medications    Prior to Admission medications   Medication Sig Start Date End Date Taking? Authorizing Provider  albuterol (PROAIR HFA) 108 (90 Base) MCG/ACT inhaler INHALE TWO PUFFS INTO THE LUNGS EVERY 6 HOURS AS NEEDED FOR WHEEZING 02/24/18   Midge Minium, MD  budesonide-formoterol (SYMBICORT) 80-4.5 MCG/ACT inhaler INHALE TWO PUFFS INTO THE LUNGS TWO TIMES DAILY. 02/24/18   Midge Minium, MD  colchicine 0.6 MG tablet Take 2 tablets by mouth. Take the 3rd tablet 1 hour later. May take 1 tablet twice daily on Day 2 onward if needed. 10/08/17   Brunetta Jeans, PA-C  diclofenac (VOLTAREN) 75 MG EC tablet Take 1 tablet (75 mg total) by mouth 2 (two) times daily. 02/02/18   Leamon Arnt, MD  hydrochlorothiazide (HYDRODIURIL) 12.5 MG tablet Take 1 tablet (12.5 mg total) by mouth daily. 05/03/17   Midge Minium, MD  loratadine (ALLERGY RELIEF) 10 MG tablet Take 10 mg by mouth daily.    [provider]  pantoprazole (PROTONIX) 40 MG tablet Take 1 tablet (40 mg total) by mouth daily. 03/11/18   Midge Minium, MD  potassium chloride SA (K-DUR,KLOR-CON) 20 MEQ tablet Take 1 tablet (20 mEq total) by mouth daily for  4 days. 06/20/18 06/24/18  Sherwood Gambler, MD  sildenafil (VIAGRA) 100 MG tablet Take 1/2 - 1 tablet by mouth as needed for erectile dysfunction. 11/26/17   Midge Minium, MD    Family History Family History  Problem Relation Age of Onset  . Hypertension Mother   . Diabetes Mother   . Hypertension Father   . Prostate cancer Paternal Uncle     Social History Social History   Tobacco Use  . Smoking status: Never Smoker  . Smokeless tobacco: Never Used  Substance Use Topics  . Alcohol use: Yes    Alcohol/week: 0.0 standard drinks    Comment: occ.  . Drug use: No     Allergies   Patient has no known  allergies.   Review of Systems Review of Systems  Constitutional: Negative for fever.  Eyes: Negative for visual disturbance.  Respiratory: Negative for cough and shortness of breath.   Cardiovascular: Negative for chest pain.  Gastrointestinal: Negative for vomiting.  Musculoskeletal: Negative for neck pain.  Neurological: Positive for seizures. Negative for weakness, numbness and headaches.  All other systems reviewed and are negative.    Physical Exam Updated Vital Signs BP 133/90   Pulse 76   Temp 98 F (36.7 C) (Oral)   Resp (!) 21   Ht 5\' 10"  (1.778 m)   Wt 115.7 kg   SpO2 96%   BMI 36.59 kg/m   Physical Exam Vitals signs and nursing note reviewed.  Constitutional:      General: He is not in acute distress.    Appearance: He is well-developed. He is obese. He is not ill-appearing or diaphoretic.  HENT:     Head: Normocephalic and atraumatic.     Right Ear: External ear normal.     Left Ear: External ear normal.     Nose: Nose normal.  Eyes:     General:        Right eye: No discharge.        Left eye: No discharge.     Extraocular Movements: Extraocular movements intact.     Pupils: Pupils are equal, round, and reactive to light.  Neck:     Musculoskeletal: Normal range of motion and neck supple.  Cardiovascular:     Rate and Rhythm: Normal rate and regular rhythm.     Heart sounds: Normal heart sounds.  Pulmonary:     Effort: Pulmonary effort is normal.     Breath sounds: Normal breath sounds.  Abdominal:     Palpations: Abdomen is soft.     Tenderness: There is no abdominal tenderness.  Skin:    General: Skin is warm and dry.  Neurological:     Mental Status: He is alert and oriented to person, place, and time.     Comments: CN 3-12 grossly intact. 5/5 strength in all 4 extremities. Grossly normal sensation. Normal finger to nose.   Psychiatric:        Mood and Affect: Mood is not anxious.      ED Treatments / Results  Labs (all labs  ordered are listed, but only abnormal results are displayed) Labs Reviewed  BASIC METABOLIC PANEL - Abnormal; Notable for the following components:      Result Value   Potassium 3.3 (*)    Calcium 8.0 (*)    All other components within normal limits  CBC WITH DIFFERENTIAL/PLATELET - Abnormal; Notable for the following components:   MCH 25.4 (*)    All other components within  normal limits  MAGNESIUM  URINALYSIS, ROUTINE W REFLEX MICROSCOPIC  RAPID URINE DRUG SCREEN, HOSP PERFORMED    EKG EKG Interpretation  Date/Time:  Monday June 20 2018 12:59:49 EDT Ventricular Rate:  98 PR Interval:    QRS Duration: 87 QT Interval:  342 QTC Calculation: 437 R Axis:   66 Text Interpretation:  Sinus rhythm Ventricular premature complex Probable left atrial enlargement no significant change since 2015 Confirmed by Sherwood Gambler [phone]) on 06/20/2018 1:17:43 PM   Radiology Ct Head Wo Contrast  Result Date: 06/20/2018 CLINICAL DATA:  Seizure activity this afternoon.  Current jaw pain. EXAM: CT HEAD WITHOUT CONTRAST TECHNIQUE: Contiguous axial images were obtained from the base of the skull through the vertex without intravenous contrast. COMPARISON:  None. FINDINGS: Brain: Ventricles, cisterns and other CSF spaces are normal. There is no mass, mass effect, shift of midline structures or acute hemorrhage. No evidence of acute infarction. Vascular: No hyperdense vessel or unexpected calcification. Skull: Normal. Negative for fracture or focal lesion. Sinuses/Orbits: Orbits are normal. Paranasal sinuses are well developed and well aerated with minimal opacification over the right frontal sinus and ethmoid air cells. Mastoid air cells are clear. Other: None. IMPRESSION: No acute findings. Minimal chronic sinus inflammatory change. Electronically Signed   By: Marin Olp M.D.   On: 06/20/2018 14:25    Procedures Procedures (including critical care time)  Medications Ordered in ED Medications   acetaminophen (TYLENOL) tablet 650 mg (650 mg Oral Given 06/20/18 1430)  potassium chloride SA (K-DUR,KLOR-CON) CR tablet 40 mEq (40 mEq Oral Given 06/20/18 1520)     Initial Impression / Assessment and Plan / ED Course  I have reviewed the triage vital signs and the nursing notes.  Pertinent labs & imaging results that were available during my care of the patient were reviewed by me and considered in my medical decision making (see chart for details).        Patient presents with what sounds like a probable seizure at home.  He has not had any further seizure-like activity.  This is his first episode.  Labs and CT are overall unremarkable besides mild hypokalemia, started replete meant in the ED.  Given he is on HCTZ at home I will give him a few more days at home.  No clear cause of the seizure otherwise and no infectious signs or symptoms.  Highly doubt acute CNS emergencies like meningitis or encephalitis.  He will be referred to neurology as an outpatient and his PCP.  He was instructed that he cannot drive until cleared by neurology.  Other return precautions discussed.  Final Clinical Impressions(s) / ED Diagnoses   Final diagnoses:  Seizure ([place])  Hypokalemia    ED Discharge Orders         Ordered    potassium chloride SA (K-DUR,KLOR-CON) 20 MEQ tablet  Daily     06/20/18 1510    Ambulatory referral to Neurology    Comments:  An appointment is requested in approximately: 2 weeks   06/20/18 1510           Sherwood Gambler, MD 06/20/18 [phone]

## 2018-08-17 ENCOUNTER — Encounter: Payer: Self-pay | Admitting: Family Medicine

## 2018-08-24 ENCOUNTER — Encounter: Payer: Self-pay | Admitting: Family Medicine

## 2019-01-12 ENCOUNTER — Other Ambulatory Visit: Payer: Self-pay | Admitting: Family Medicine

## 2019-03-09 ENCOUNTER — Other Ambulatory Visit: Payer: Self-pay | Admitting: Family Medicine

## 2019-04-04 ENCOUNTER — Encounter: Payer: Self-pay | Admitting: Family Medicine

## 2019-04-30 ENCOUNTER — Encounter: Payer: Self-pay | Admitting: Family Medicine

## 2020-01-21 ENCOUNTER — Other Ambulatory Visit: Payer: Self-pay

## 2020-01-21 ENCOUNTER — Ambulatory Visit (HOSPITAL_COMMUNITY)
Admission: EM | Admit: 2020-01-21 | Discharge: 2020-01-21 | Disposition: A | Discharge status: Home / Self Care | Payer: BLUE CROSS/BLUE SHIELD

## 2020-01-21 DIAGNOSIS — F320 Major depressive disorder, single episode, mild: Secondary | ICD-10-CM

## 2020-01-21 HISTORY — DX: Major depressive disorder, single episode, mild: F32.0

## 2020-01-21 NOTE — BH Assessment (Signed)
Comprehensive Clinical Assessment (CCA) Note  01/22/2020 [name]. [id number]  Visit Diagnosis:      ICD-10-CM   1. Current mild episode of major depressive disorder without prior episode ([place])  F32.0       CCA Screening, Triage and Referral (STR) [name]. is a 54 year old patient who came to the [facility]) due to experiencing ongoing anxiety and depression over the last several weeks. Pt shares that several months ago his wife moved in a younger man and that man's two young children, despite his opposition that it wasn't a good idea. Along with those three people, they brought a younger woman who was apparently homeless and, after some time, pt and this woman had an affair. Pt states that he and this woman continued this affair, which turned into a relationship; he states he enjoyed his time with this younger woman and they went bowling, to [place], to the park, and to the town in which he grew up. He states that, one day in which he was at a doctor appointment and this woman was in his car waiting for him, his wife told the woman that she would buy her a bus ticket to anywhere she wanted to go, and the girl left. Pt was upset regarding this and states his wife's excuse was, "I wanted my husband back."  Pt states that, last week, he went down to visit the young woman and, long story short, she gave him the wrong apartment so he couldn't find her and, when he did find where she was living, she wouldn't come to the door. She later said he could come to her job at [employer] and he went to three different [employer] and still never found her. He states "I can't get her out of my mind," even though he states he realizes she has lied to him numerous times and is more than likely not good for him.  Pt declines he is experiencing SI or has a plan to harm himself, though he states that last Tuesday his wife called the police to look for him while he was on his  way back from [place], [place] due to him saying things into the phone that he says were things he's never said before. Pt states the things he said "weren't me," and that they weren't suicidal, but that they were "demonic" and more terrible towards his wife than anything. Pt denies he's ever attempted to harm or kill himself. He denies he's ever been hospitalized for mental health concerns. He denies HI or AVH. He denies SA. He denies access to guns/weapons.   Pt's protective factors include a lack of SI and HI.  Pt declined to provide verbal consent for clinician to make contact with any friends/family members.  Pt was oriented x5. His recent/remote memory is intact. Pt was cooperative throughout the assessment process. Pt's insight, judgement, and impulse control is poor - fair at this time.   Patient Reported Information How did you hear about [place]? Self  Referral name: N/A  Referral phone number: No data recorded  Whom do you see for routine medical problems? Primary Care  Practice/Facility Name: [facility]  Practice/Facility Phone Number: [phone]  Name of Contact: N/A  Contact Number: N/A  Contact Fax Number: N/A  Prescriber Name: Kaleen Odea, PA  Prescriber Address (if known): Unknown   What Is the Reason for Your Visit/Call Today? Pt shares he has increasing anxiety and  depression  How Long Has This Been Causing You Problems? 1-6 months  What Do You Feel Would Help You the Most Today? Therapy   Have You Recently Been in Any Inpatient Treatment (Hospital/Detox/Crisis Center/28-Day Program)? No  Name/Location of Program/Hospital:No data recorded How Long Were You There? No data recorded When Were You Discharged? No data recorded  Have You Ever Received Services From [facility] Before? No  Who Do You See at [facility]? No data recorded  Have You Recently Had Any Thoughts About Hurting Yourself? No  Are You Planning to Commit Suicide/Harm  Yourself At This time? No   Have you Recently Had Thoughts About [place]? No  Explanation: No data recorded  Have You Used Any Alcohol or Drugs in the Past 24 Hours? No  How Long Ago Did You Use Drugs or Alcohol? No data recorded What Did You Use and How Much? No data recorded  Do You Currently Have a Therapist/Psychiatrist? No  Name of Therapist/Psychiatrist: No data recorded  Have You Been Recently Discharged From Any Office Practice or Programs? No  Explanation of Discharge From Practice/Program: No data recorded    CCA Screening Triage Referral Assessment Type of Contact: Face-to-Face  Is this Initial or Reassessment? No data recorded Date Telepsych consult ordered in [facility]:  No data recorded Time Telepsych consult ordered in [facility]:  No data recorded  Patient Reported Information Reviewed? Yes  Patient Left Without Being Seen? No data recorded Reason for Not Completing Assessment: No data recorded  Collateral Involvement: Pt declined to provide verbal consent for clinician to contact friends/family for collateral information.   Does Patient Have a Stage manager Guardian? No data recorded Name and Contact of Legal Guardian: No data recorded If Minor and Not Living with Parent(s), Who has Custody? N/A  Is CPS involved or ever been involved? Never  Is APS involved or ever been involved? Never   Patient Determined To Be At Risk for Harm To Self or Others Based on Review of Patient Reported Information or Presenting Complaint? No  Method: No data recorded Availability of Means: No data recorded Intent: No data recorded Notification Required: No data recorded Additional Information for Danger to Others Potential: No data recorded Additional Comments for Danger to Others Potential: No data recorded Are There Guns or Other Weapons in Your Home? No data recorded Types of Guns/Weapons: No data recorded Are These Weapons Safely Secured?                             No data recorded Who Could Verify You Are Able To Have These Secured: No data recorded Do You Have any Outstanding Charges, Pending Court Dates, Parole/Probation? No data recorded Contacted To Inform of Risk of Harm To Self or Others: No data recorded  Location of Assessment: [facility]   Does Patient Present under Involuntary Commitment? No  IVC Papers Initial File Date: No data recorded  [place] of Residence: [place]   Patient Currently Receiving the Following Services: Medication Management   Determination of Need: Routine (7 days)   Options For Referral: Medication Management;Outpatient Therapy     CCA Biopsychosocial  Intake/Chief Complaint:  CCA Intake With Chief Complaint Chief Complaint/Presenting Problem: Pt shares he has increasing anxiety and depression Patient's Currently Reported Symptoms/Problems: Pt shares he cannot get his lover out of his head; he's been having difficulties sleeping and making good choices. Individual's Strengths: UTA Individual's Preferences: Pt  would like "someone to talk to." Individual's Abilities: UTA Type of Services Patient Feels Are Needed: Pt would like "someone to talk to." Initial Clinical Notes/Concerns: N/A  Mental Health Symptoms Depression:  Depression: Difficulty Concentrating, Sleep (too much or little), Tearfulness  Mania:  Mania: None  Anxiety:   Anxiety: Worrying, Sleep, Difficulty concentrating  Psychosis:  Psychosis: None  Trauma:  Trauma: None  Obsessions:  Obsessions: Cause anxiety, Disrupts routine/functioning, Intrusive/time consuming, Poor insight, Recurrent & persistent thoughts/impulses/images  Compulsions:  Compulsions: Disrupts with routine/functioning, "Driven" to perform behaviors/acts, Intrusive/time consuming, Poor Insight  Inattention:  Inattention: None  Hyperactivity/Impulsivity:  Hyperactivity/Impulsivity: N/A  Oppositional/Defiant Behaviors:  Oppositional/Defiant  Behaviors: None  Emotional Irregularity:  Emotional Irregularity: Intense/inappropriate anger, Intense/unstable relationships, Potentially harmful impulsivity  Other Mood/Personality Symptoms:  Other Mood/Personality Symptoms: N./A   Mental Status Exam Appearance and self-care  Stature:  Stature: Average  Weight:  Weight: Average weight  Clothing:  Clothing: Casual  Grooming:  Grooming: Normal  Cosmetic use:  Cosmetic Use: None  Posture/gait:  Posture/Gait: Normal  Motor activity:  Motor Activity: Not Remarkable  Sensorium  Attention:  Attention: Normal  Concentration:  Concentration: Normal  Orientation:  Orientation: X5  Recall/memory:  Recall/Memory: Normal  Affect and Mood  Affect:  Affect: Anxious  Mood:  Mood: Anxious, Hopeless  Relating  Eye contact:  Eye Contact: Normal  Facial expression:  Facial Expression: Anxious, Sad  Attitude toward examiner:  Attitude Toward Examiner: Cooperative  Thought and Language  Speech flow: Speech Flow: Normal  Thought content:  Thought Content: Appropriate to Mood and Circumstances  Preoccupation:  Preoccupations: Guilt  Hallucinations:  Hallucinations: None  Organization:     Transport planner of Knowledge:  Fund of Knowledge: Average  Intelligence:  Intelligence: Average  Abstraction:  Abstraction: Normal  Judgement:  Judgement: Impaired  Reality Testing:  Reality Testing: Variable  Insight:  Insight: Denial, Flashes of insight  Decision Making:  Decision Making: Impulsive  Social Functioning  Social Maturity:  Social Maturity: Impulsive  Social Judgement:  Social Judgement: Naive  Stress  Stressors:  Stressors: Relationship, Housing, Grief/losses, Transitions, Family conflict  Coping Ability:  Coping Ability: English as a second language teacher Deficits:  Skill Deficits: Environmental health practitioner, Interpersonal, Self-control  Supports:  Supports: Support needed     Religion: Religion/Spirituality Are You A Religious Person?: Yes What is  Your Religious Affiliation?:  (Unknown) How Might This Affect Treatment?: UTA  Leisure/Recreation: Leisure / Recreation Do You Have Hobbies?:  (UTA)  Exercise/Diet: Exercise/Diet Do You Exercise?:  (UTA) Have You Gained or Lost A Significant Amount of Weight in the Past Six Months?:  (UTA) Do You Follow a Special Diet?:  (UTA) Do You Have Any Trouble Sleeping?: Yes Explanation of Sleeping Difficulties: Pt has been having difficulties falling and staying asleep.   CCA Employment/Education  Employment/Work Situation: Employment / Work Situation Employment situation: Employed Where is patient currently employed?: N/A How long has patient been employed?: N/A Patient's job has been impacted by current illness:  (N/A) What is the longest time patient has a held a job?: N/A Where was the patient employed at that time?: N/A Has patient ever been in the [employer]?:  (N/A)  Education: Education Is Patient Currently Attending School?: No Last Grade Completed:  (N/A) Name of [place]: N/A Did Teacher, adult education From [employer]?:  (N/A) Did You Attend College?:  (N/A) Did You Attend Graduate School?:  (N/A) Did You Have Any Special Interests In School?: N/A Did You Have An Individualized Education Program (IIEP):  (  N/A) Did You Have Any Difficulty At School?:  (N/A) Patient's Education Has Been Impacted by Current Illness:  (N/A)   CCA Family/Childhood History  Family and Relationship History: Family history Marital status: Married Number of Years Married:  (UTA) What types of issues is patient dealing with in the relationship?: Pt had an affair and continues to maintain (or attempt to maintain) a relationship with the woman he had an affair with. Additional relationship information: The woman pt had an affair with told him the wrong home address, the wrong work address, etc. Pt continues to call and text message her. Are you sexually active?: Yes What is your sexual orientation?:  N/A Has your sexual activity been affected by drugs, alcohol, medication, or emotional stress?: UTA Does patient have children?: Yes How many children?: 3 (3 biological, 1 non-biological) How is patient's relationship with their children?: Strained since he told the children about the affair.  Childhood History:  Childhood History By whom was/is the patient raised?:  (N/A) Additional childhood history information: N/A Description of patient's relationship with caregiver when they were a child: N/A Patient's description of current relationship with people who raised him/her: N/A How were you disciplined when you got in trouble as a child/adolescent?: N/A Does patient have siblings?:  (N/A) Did patient suffer any verbal/emotional/physical/sexual abuse as a child?: No Did patient suffer from severe childhood neglect?: No Has patient ever been sexually abused/assaulted/raped as an adolescent or adult?: No Was the patient ever a victim of a crime or a disaster?: No Witnessed domestic violence?: No Has patient been affected by domestic violence as an adult?: No  Child/Adolescent Assessment:     CCA Substance Use  Alcohol/Drug Use: Alcohol / Drug Use Pain Medications: Please see MAR Prescriptions: Please see MAR Over the Counter: Please see MAR History of alcohol / drug use?: No history of alcohol / drug abuse Longest period of sobriety (when/how long): N/A                         ASAM's:  Six Dimensions of Multidimensional Assessment  Dimension 1:  Acute Intoxication and/or Withdrawal Potential:      Dimension 2:  Biomedical Conditions and Complications:      Dimension 3:  Emotional, Behavioral, or Cognitive Conditions and Complications:     Dimension 4:  Readiness to Change:     Dimension 5:  Relapse, Continued use, or Continued Problem Potential:     Dimension 6:  Recovery/Living Environment:     ASAM Severity Score:    ASAM Recommended Level of Treatment:      Substance use Disorder (SUD)    Recommendations for Services/Supports/Treatments: Ysidro Evert, NP, reviewed pt's chart and information and met with pt and determined pt does not meet inpatient criteria. Pt expressed the need to "talk with someone," but clinician had talked with pt for almost 90 minutes (due to the length of the situation that has resulted in his depression/anxiety) and with NP and apparently still felt he needed someone to talk to about his situation. Pt was provided information about the [facility] and was advised to attend the open access hours available. Pt expressed an understanding and stated he has no questions.    DSM5 Diagnoses: Patient Active Problem List   Diagnosis Date Noted  . Current mild episode of major depressive disorder without prior episode ([place]) 01/21/2020  . Vitamin B12 deficiency 02/03/2018  . Anxiety and depression 05/17/2017  . Polyarthritis 12/28/2016  .  Erectile dysfunction 12/18/2015  . Postprandial bloating 06/28/2015  . HTN (hypertension), benign 02/08/2015  . Insomnia 02/08/2015  . Atypical nevus of back 06/27/2014  . GERD (gastroesophageal reflux disease) 02/20/2014  . Pain in joint, ankle and foot 11/08/2013  . Low testosterone 04/24/2013  . Routine general medical examination at a health care facility 04/24/2013  . Obesity (BMI 30-39.9) 04/24/2013  . Decreased libido 10/31/2012  . Asthma 03/12/2011  . NEOPLASM, SKIN, UNCERTAIN BEHAVIOR 73/42/8768  . GOUT 05/24/2007  . ALLERGIC RHINITIS 10/19/2006    Patient Centered Plan: Patient is on the following Treatment Plan(s):  Anxiety and Impulse Control   Referrals to Alternative Service(s): Referred to Alternative Service(s):   Place:   Date:   Time:    Referred to Alternative Service(s):   Place:   Date:   Time:    Referred to Alternative Service(s):   Place:   Date:   Time:    Referred to Alternative Service(s):   Place:   Date:   Time:     Dannielle Burn

## 2020-01-21 NOTE — Discharge Instructions (Signed)
Patient is being discharged with outpatient resources.

## 2020-01-21 NOTE — ED Triage Notes (Signed)
Pt arrives as walk in with wife. Pt complains of worsening anxiety & depression over last few weeks. Pt reports relationship issues as primary cause. Pt denies SI/HI/AVH, just wants to 'go away and live like a hermit. Pt with poor eye contact, short answers but calm & cooperative. In no acute distress at this time.

## 2020-01-21 NOTE — ED Provider Notes (Signed)
Behavioral Health Urgent Care Medical Screening Exam  Patient Name: [name]. MRN: [id number] Date of Evaluation: 01/21/20 Chief Complaint: Patient wants to speak with someone. Diagnosis: Current mild episode of major depressive disorder without prior episode ([place]) Comment   History of Present illness: [name]. is a 54 y.o. male patient who came to [facility]) due to experiencing ongoing anxiety and depression over the last several weeks. The patient has been seen as anxious, sad, and voice; he needs to talk to someone. TTS Counselor Ms. Terie Purser spoke with the patient for an hour and a half. It was discussed with the patient his blood pressure was elevated, and maybe he could be seen at the ED due to his increased blood pressure. The patient adamantly in an agreement to remain here. He voiced, "I know what I have to do is to go home and take my blood pressure medication.     The patient does not appear to be responding to internal or external stimuli. Neither is the patient presenting with any delusional thinking. The patient denies auditory or visual hallucinations. The patient denies any suicidal, homicidal, or self-harm ideations. The patient is not presenting with any psychotic or paranoid behaviors. During an encounter with the patient, he was able to answer questions appropriately. The patient is casually dressed, alert, and oriented x4. The patient speaks in a clear tone, at moderate volume, and a normal pace. Motor behavior appears normal. Eye contact is good. The patient's mood is depressed, and his affect is congruent with his mood. The thought process is coherent and relevant. There is no indication the patient is currently responding to internal stimuli or experiencing delusional thought content. The patient was cooperative throughout the assessment. He is requesting inpatient treatment for mental health and substance use. He denies access to  guns/weapons.   Psychiatric Specialty Exam  Presentation  General Appearance:Appropriate for Environment  Eye Contact:Good  Speech:Clear and Coherent  Speech Volume:Normal  Handedness:Right   Mood and Affect  Mood:Anxious;Depressed  Affect:Blunt;Congruent   Thought Process  Thought Processes:Coherent  Descriptions of Associations:Intact  Orientation:Full (Time, Place and Person)  Thought Content:Logical;WDL  Hallucinations:None  Ideas of Reference:None  Suicidal Thoughts:No  Homicidal Thoughts:No   Sensorium  Memory:Immediate Good;Recent Good;Remote Good  Judgment:Good  Insight:Fair   Executive Functions  Concentration:Good  Attention Span:Good  [place] of Knowledge:Good  Language:Good   Psychomotor Activity  Psychomotor Activity:Normal   Assets  Assets:Communication Skills;Desire for Improvement;Resilience;Social Support   Sleep  Sleep:Fair  Number of hours: No data recorded  Physical Exam: Physical Exam Vitals and nursing note reviewed.  Constitutional:      Appearance: Normal appearance. He is obese.  HENT:     Right Ear: Tympanic membrane normal.     Left Ear: Tympanic membrane normal.     Nose: Nose normal.  Cardiovascular:     Rate and Rhythm: Normal rate.  Pulmonary:     Effort: Pulmonary effort is normal.  Musculoskeletal:        General: Normal range of motion.     Cervical back: Normal range of motion and neck supple.  Neurological:     General: No focal deficit present.     Mental Status: He is alert and oriented to person, place, and time.  Psychiatric:        Attention and Perception: Attention and perception normal.        Mood and Affect: Mood is anxious and depressed. Affect is blunt and  flat.        Speech: Speech normal.        Behavior: Behavior normal. Behavior is cooperative.        Thought Content: Thought content normal.        Cognition and Memory: Cognition and memory normal.         Judgment: Judgment normal.    Review of Systems  Psychiatric/Behavioral: Positive for depression. The patient is nervous/anxious.   All other systems reviewed and are negative.  Blood pressure (!) 173/115, pulse 96, temperature (!) 97.3 F (36.3 C), temperature source Temporal, resp. rate 18, SpO2 95 %. There is no height or weight on file to calculate BMI.  Musculoskeletal: Strength & Muscle Tone: within normal limits Gait & Station: normal Patient leans: N/A   [place] MSE Discharge Disposition for Follow up and Recommendations: Based on my evaluation the patient does not appear to have an emergency medical condition and can be discharged with resources and follow up care in outpatient services for Medication Management and Individual Therapy   Caroline Sauger, NP 01/21/2020, 10:12 PM

## 2020-01-29 ENCOUNTER — Other Ambulatory Visit: Payer: Self-pay | Admitting: Family Medicine

## 2020-07-21 ENCOUNTER — Emergency Department (HOSPITAL_COMMUNITY): Payer: BLUE CROSS/BLUE SHIELD

## 2020-07-21 ENCOUNTER — Encounter (HOSPITAL_COMMUNITY): Payer: Self-pay | Admitting: Emergency Medicine

## 2020-07-21 ENCOUNTER — Emergency Department (HOSPITAL_COMMUNITY)
Admission: EM | Admit: 2020-07-21 | Discharge: 2020-07-21 | Disposition: A | Discharge status: Home / Self Care | Payer: BLUE CROSS/BLUE SHIELD | Attending: Emergency Medicine | Admitting: Emergency Medicine

## 2020-07-21 DIAGNOSIS — J45909 Unspecified asthma, uncomplicated: Secondary | ICD-10-CM | POA: Insufficient documentation

## 2020-07-21 DIAGNOSIS — Z79899 Other long term (current) drug therapy: Secondary | ICD-10-CM | POA: Diagnosis not present

## 2020-07-21 DIAGNOSIS — M25561 Pain in right knee: Secondary | ICD-10-CM | POA: Diagnosis not present

## 2020-07-21 DIAGNOSIS — I10 Essential (primary) hypertension: Secondary | ICD-10-CM | POA: Diagnosis not present

## 2020-07-21 MED ORDER — HYDROCODONE-ACETAMINOPHEN 5-325 MG PO TABS
1.0000 | ORAL_TABLET | Freq: Once | ORAL | Status: AC
  Administered 2020-07-21: 1 via ORAL
  Filled 2020-07-21: qty 1

## 2020-07-21 NOTE — ED Triage Notes (Signed)
Patient reports trip and fall yesterday while out of town this weekend. C/o right knee pain with swelling since that time.

## 2020-07-21 NOTE — ED Provider Notes (Signed)
[facility] DEPT Provider Note   CSN: [id number] Arrival date & time: 07/21/20  1950     History Chief Complaint  Patient presents with  . Knee Pain    [name]. is a 55 y.o. male with pmh as listed below. Not anticoagulated.  HPI Patient presents to emergency department today with chief complaint of right knee pain x 1 day after mechanical fall. He was in [place] for the weekend and out on a walk when he tripped on a hose laying on the sidewalk. He fell landing on his right knee. He has had constant throbbing pain localized to right knee. Pain is worse with movement, especially bending the knee. Pain 8/10. He noticed swelling this morning. Took ibuprofen without symptom improvement. Denies fever, chills, numbness, tingling, weakness.    Past Medical History:  Diagnosis Date  . Asthma   . Gout   . H/O vasectomy   . Hypertension     Patient Active Problem List   Diagnosis Date Noted  . Current mild episode of major depressive disorder without prior episode ([place]) 01/21/2020  . Vitamin B12 deficiency 02/03/2018  . Anxiety and depression 05/17/2017  . Polyarthritis 12/28/2016  . Erectile dysfunction 12/18/2015  . Postprandial bloating 06/28/2015  . HTN (hypertension), benign 02/08/2015  . Insomnia 02/08/2015  . Atypical nevus of back 06/27/2014  . GERD (gastroesophageal reflux disease) 02/20/2014  . Pain in joint, ankle and foot 11/08/2013  . Low testosterone 04/24/2013  . Routine general medical examination at a health care facility 04/24/2013  . Obesity (BMI 30-39.9) 04/24/2013  . Decreased libido 10/31/2012  . Asthma 03/12/2011  . NEOPLASM, SKIN, UNCERTAIN BEHAVIOR 21/30/8657  . GOUT 05/24/2007  . ALLERGIC RHINITIS 10/19/2006    Past Surgical History:  Procedure Laterality Date  . ANTERIOR CRUCIATE LIGAMENT REPAIR    . TONSILECTOMY, ADENOIDECTOMY, BILATERAL MYRINGOTOMY AND TUBES    . VASECTOMY         Family History   Problem Relation Age of Onset  . Hypertension Mother   . Diabetes Mother   . Hypertension Father   . Prostate cancer Paternal Uncle     Social History   Tobacco Use  . Smoking status: Never Smoker  . Smokeless tobacco: Never Used  Vaping Use  . Vaping Use: Never used  Substance Use Topics  . Alcohol use: Yes    Alcohol/week: 0.0 standard drinks    Comment: occ.  . Drug use: No    Home Medications Prior to Admission medications   Medication Sig Start Date End Date Taking? Authorizing Provider  albuterol (PROAIR HFA) 108 (90 Base) MCG/ACT inhaler INHALE TWO PUFFS INTO THE LUNGS EVERY 6 HOURS AS NEEDED FOR WHEEZING 03/09/19   Midge Minium, MD  budesonide-formoterol (SYMBICORT) 80-4.5 MCG/ACT inhaler INHALE TWO PUFFS INTO THE LUNGS TWICE A DAY 03/09/19   Midge Minium, MD  colchicine 0.6 MG tablet Take 2 tablets by mouth. Take the 3rd tablet 1 hour later. May take 1 tablet twice daily on Day 2 onward if needed. 10/08/17   Brunetta Jeans, PA-C  diclofenac (VOLTAREN) 75 MG EC tablet Take 1 tablet (75 mg total) by mouth 2 (two) times daily. 02/02/18   Leamon Arnt, MD  hydrochlorothiazide (HYDRODIURIL) 12.5 MG tablet Take 1 tablet (12.5 mg total) by mouth daily. 05/03/17   Midge Minium, MD  loratadine (ALLERGY RELIEF) 10 MG tablet Take 10 mg by mouth daily.    [provider]  pantoprazole ([place])  40 MG tablet Take 1 tablet (40 mg total) by mouth daily. 03/11/18   Midge Minium, MD  potassium chloride SA (K-DUR,KLOR-CON) 20 MEQ tablet Take 1 tablet (20 mEq total) by mouth daily for 4 days. 06/20/18 06/24/18  Sherwood Gambler, MD  sildenafil (VIAGRA) 100 MG tablet TAKE 1/2 TO 1 TABLET BY MOUTH AS NEEDED FOR ERECTILE DYSFUNCTION 01/29/20   Midge Minium, MD    Allergies    Patient has no known allergies.  Review of Systems   Review of Systems All other systems are reviewed and are negative for acute change except as noted in the  HPI.  Physical Exam Updated Vital Signs BP (!) 172/97   Pulse 95   Temp 98.2 F (36.8 C) (Oral)   Resp 17   SpO2 96%   Physical Exam Vitals and nursing note reviewed.  Constitutional:      Appearance: He is well-developed. He is not ill-appearing or toxic-appearing.  HENT:     Head: Normocephalic and atraumatic.     Nose: Nose normal.  Eyes:     General: No scleral icterus.       Right eye: No discharge.        Left eye: No discharge.     Conjunctiva/sclera: Conjunctivae normal.  Neck:     Vascular: No JVD.  Cardiovascular:     Rate and Rhythm: Normal rate and regular rhythm.     Pulses: Normal pulses.          Dorsalis pedis pulses are 2+ on the right side and 2+ on the left side.     Heart sounds: Normal heart sounds.  Pulmonary:     Effort: Pulmonary effort is normal.     Breath sounds: Normal breath sounds.  Abdominal:     General: There is no distension.  Musculoskeletal:     Cervical back: Normal range of motion.     Right hip: Normal.     Right knee: Swelling and bony tenderness present. No erythema, ecchymosis, lacerations or crepitus. Decreased range of motion. No LCL laxity, MCL laxity, ACL laxity or PCL laxity. Normal alignment, normal meniscus and normal patellar mobility. Normal pulse.     Right ankle: Normal.     Comments: Compartments in right lower extremity are soft.  Skin:    General: Skin is warm and dry.  Neurological:     Mental Status: He is oriented to person, place, and time.     GCS: GCS eye subscore is 4. GCS verbal subscore is 5. GCS motor subscore is 6.     Comments: Fluent speech, no facial droop.  Psychiatric:        Behavior: Behavior normal.     ED Results / Procedures / Treatments   Labs (all labs ordered are listed, but only abnormal results are displayed) Labs Reviewed - No data to display  EKG None  Radiology DG Knee Complete 4 Views Right  Result Date: 07/21/2020 CLINICAL DATA:  Status post fall. EXAM: RIGHT KNEE -  COMPLETE 4+ VIEW COMPARISON:  None. FINDINGS: No evidence of acute fracture or dislocation. No evidence of arthropathy or other focal bone abnormality. A small to moderate sized joint effusion is seen with associated soft tissue swelling. IMPRESSION: Small to moderate sized joint effusion. Electronically Signed   By: Virgina Norfolk M.D.   On: 07/21/2020 21:55    Procedures Procedures   Medications Ordered in ED Medications  HYDROcodone-acetaminophen (NORCO/VICODIN) 5-325 MG per tablet 1 tablet (1 tablet Oral  Given 07/21/20 2122)    ED Course  I have reviewed the triage vital signs and the nursing notes.  Pertinent labs & imaging results that were available during my care of the patient were reviewed by me and considered in my medical decision making (see chart for details).    MDM Rules/Calculators/A&P                          History provided by patient with additional history obtained from chart review.    Patient presents to the ED with complaints of pain to the right knee s/p injury mechanical fall. Exam without obvious deformity or open wounds. ROM decreased 2/2 to pain. Tender to palpation to patella. NVI distally. Xray viewed by me is negative for fracture/dislocation, there is small joint effusion. Agree with radiologist. Patient given dose of norco for pain here.  Therapeutic splint provided. PRICE and motrin recommended. I discussed results, treatment plan, need for ortho follow-up, and return precautions with the patient. Provided opportunity for questions, patient confirmed understanding and are in agreement with plan.    Portions of this note were generated with [employer]. Dictation errors may occur despite best attempts at proofreading.   Final Clinical Impression(s) / ED Diagnoses Final diagnoses:  Acute pain of right knee    Rx / DC Orders ED Discharge Orders    None       Barrie Folk, PA-C 07/22/20 2023    Varney Biles,  MD 07/23/20 1646

## 2020-07-21 NOTE — Discharge Instructions (Signed)
Xray shows small to moderate sized joint effusion.   Elevate your leg as much as possible.  Apply ice for pain and swelling.  You can take Tylenol and Motrin for pain and swelling as well.  Follow-up with orthopedics as we discussed.

## 2020-12-11 ENCOUNTER — Other Ambulatory Visit: Payer: Self-pay | Admitting: Family Medicine

## 2020-12-12 ENCOUNTER — Other Ambulatory Visit: Payer: Self-pay | Admitting: Family Medicine

## 2021-02-03 ENCOUNTER — Other Ambulatory Visit: Payer: Self-pay

## 2021-02-03 DIAGNOSIS — N521 Erectile dysfunction due to diseases classified elsewhere: Secondary | ICD-10-CM

## 2021-07-21 ENCOUNTER — Encounter: Payer: Self-pay | Admitting: Family Medicine

## 2021-07-21 ENCOUNTER — Ambulatory Visit (INDEPENDENT_AMBULATORY_CARE_PROVIDER_SITE_OTHER): Payer: Managed Care, Other (non HMO) | Admitting: Family Medicine

## 2021-07-21 VITALS — BP 183/110 | HR 96 | Temp 97.3°F | Ht 70.0 in | Wt 300.2 lb

## 2021-07-21 DIAGNOSIS — J309 Allergic rhinitis, unspecified: Secondary | ICD-10-CM

## 2021-07-21 DIAGNOSIS — Z6841 Body Mass Index (BMI) 40.0 and over, adult: Secondary | ICD-10-CM | POA: Diagnosis not present

## 2021-07-21 DIAGNOSIS — I878 Other specified disorders of veins: Secondary | ICD-10-CM

## 2021-07-21 DIAGNOSIS — K219 Gastro-esophageal reflux disease without esophagitis: Secondary | ICD-10-CM

## 2021-07-21 DIAGNOSIS — E119 Type 2 diabetes mellitus without complications: Secondary | ICD-10-CM

## 2021-07-21 DIAGNOSIS — N521 Erectile dysfunction due to diseases classified elsewhere: Secondary | ICD-10-CM

## 2021-07-21 DIAGNOSIS — R7989 Other specified abnormal findings of blood chemistry: Secondary | ICD-10-CM

## 2021-07-21 DIAGNOSIS — I10 Essential (primary) hypertension: Secondary | ICD-10-CM

## 2021-07-21 DIAGNOSIS — J45909 Unspecified asthma, uncomplicated: Secondary | ICD-10-CM

## 2021-07-21 DIAGNOSIS — Z125 Encounter for screening for malignant neoplasm of prostate: Secondary | ICD-10-CM | POA: Diagnosis not present

## 2021-07-21 DIAGNOSIS — E785 Hyperlipidemia, unspecified: Secondary | ICD-10-CM | POA: Diagnosis not present

## 2021-07-21 DIAGNOSIS — E1169 Type 2 diabetes mellitus with other specified complication: Secondary | ICD-10-CM | POA: Insufficient documentation

## 2021-07-21 MED ORDER — LAMOTRIGINE 25 MG PO TABS: 100.0000 mg | ORAL_TABLET | Freq: Two times a day (BID) | ORAL | 2 refills | Status: DC

## 2021-07-21 MED ORDER — HYDROCHLOROTHIAZIDE 12.5 MG PO TABS: 12.5000 mg | ORAL_TABLET | Freq: Every day | ORAL | 3 refills | Status: DC

## 2021-07-21 NOTE — Assessment & Plan Note (Signed)
Hemoglobin A1c approximately 9 months ago was 6.5 ?Recommend diet and exercise ?Recheck hemoglobin A1c ?

## 2021-07-21 NOTE — Progress Notes (Addendum)
? ?New Patient Office Visit ? ?Subjective   ? ?Patient ID: George Needle., male    DOB: 1966-03-20  Age: 56 y.o. MRN: [id number] ? ?CC:  ?Chief Complaint  ?Patient presents with  ? Establish Care  ?  Est care. No concerns.  ? ? ?HPI ?George Needle. presents to establish care. ? ?Patient with history of high blood pressure, has been without some of his blood pressure medicine.  Does take losartan which he has.  Has been without labetalol and hydrochlorothiazide.  Denies chest pain, shortness of breath, has left lower extremity swelling, mild.  This is chronic.  Improved with compression stockings. ? ?Patient has history of low testosterone, he has taken testosterone supplements for this before, however recent trouble getting this covered by insurance, has not been with any medication for a while. ? ?Patient has a history of seizures, follows with neurology.  Takes lamotrigine.  Has not had a seizure in 2 years. ? ?Patient has seasonal allergies, which are controlled with montelukast and loratadine. ? ?Patient has a history of erectile dysfunction, not improved with Viagra. ? ?Patient recently had diagnosis of diabetes on recent blood work.  Denies any polyuria or polydipsia. ? ?Patient has a history of mild asthma, this is controlled with Symbicort and as needed albuterol. ? ? ? ?Outpatient Encounter Medications as of 07/21/2021  ?Medication Sig  ? albuterol (PROAIR HFA) 108 (90 Base) MCG/ACT inhaler INHALE TWO PUFFS INTO THE LUNGS EVERY 6 HOURS AS NEEDED FOR WHEEZING  ? budesonide-formoterol (SYMBICORT) 80-4.5 MCG/ACT inhaler INHALE TWO PUFFS INTO THE LUNGS TWICE A DAY  ? labetalol (NORMODYNE) 200 MG tablet Take by mouth.  ? loratadine (CLARITIN) 10 MG tablet Take 10 mg by mouth daily.  ? losartan (COZAAR) 50 MG tablet Take 1 tablet by mouth daily.  ? montelukast (SINGULAIR) 10 MG tablet Take 1 tablet by mouth daily.  ? sildenafil (VIAGRA) 100 MG tablet TAKE 1/2 TO 1 TABLET BY MOUTH AS NEEDED FOR ERECTILE  DYSFUNCTION  ? [DISCONTINUED] fluticasone-salmeterol (ADVAIR) 100-50 MCG/ACT AEPB Inhale into the lungs.  ? [DISCONTINUED] hydrochlorothiazide (HYDRODIURIL) 12.5 MG tablet Take 1 tablet (12.5 mg total) by mouth daily.  ? [DISCONTINUED] lamoTRIgine (LAMICTAL) 25 MG tablet Take by mouth.  ? [DISCONTINUED] lamoTRIgine (LAMICTAL) 25 MG tablet Take by mouth.  ? [DISCONTINUED] pantoprazole (PROTONIX) 40 MG tablet Take 1 tablet (40 mg total) by mouth daily.  ? hydrochlorothiazide (HYDRODIURIL) 12.5 MG tablet Take 1 tablet (12.5 mg total) by mouth daily.  ? lamoTRIgine (LAMICTAL) 25 MG tablet Take 4 tablets (100 mg total) by mouth 2 (two) times daily.  ? [DISCONTINUED] budesonide-formoterol (SYMBICORT) 80-4.5 MCG/ACT inhaler Symbicort 80 mcg-4.5 mcg/actuation HFA aerosol inhaler  ? [DISCONTINUED] colchicine 0.6 MG tablet Take 2 tablets by mouth. Take the 3rd tablet 1 hour later. May take 1 tablet twice daily on Day 2 onward if needed. (Patient not taking: Reported on 07/21/2021)  ? [DISCONTINUED] diclofenac (VOLTAREN) 75 MG EC tablet Take 1 tablet (75 mg total) by mouth 2 (two) times daily. (Patient not taking: Reported on 07/21/2021)  ? [DISCONTINUED] fluticasone-salmeterol (ADVAIR) 100-50 MCG/ACT AEPB Inhale 1 puff into the lungs 2 (two) times daily.  ? [DISCONTINUED] hydrochlorothiazide (HYDRODIURIL) 25 MG tablet Take 25 mg by mouth daily.  ? [DISCONTINUED] potassium chloride SA (K-DUR,KLOR-CON) 20 MEQ tablet Take 1 tablet (20 mEq total) by mouth daily for 4 days.  ? ?No facility-administered encounter medications on file as of 07/21/2021.  ? ? ?Past Medical History:  ?Diagnosis Date  ?  Anxiety and depression 05/17/2017  ? Asthma   ? Atypical nevus of back 06/27/2014  ? Current mild episode of major depressive disorder without prior episode ([place]) 01/21/2020  ? Decreased libido 10/31/2012  ? Gout   ? GOUT 05/24/2007  ? Qualifier: Diagnosis of  By: Linna Darner MD, Gwyndolyn Saxon    ? H/O vasectomy   ? Hypertension   ? Insomnia 02/08/2015  ?  NEOPLASM, SKIN, UNCERTAIN BEHAVIOR 03/27/9456  ? Qualifier: Diagnosis of  By: Birdie Riddle MD, Belenda Cruise    ? Polyarthritis 12/28/2016  ? ? ?Past Surgical History:  ?Procedure Laterality Date  ? ANTERIOR CRUCIATE LIGAMENT REPAIR    ? TONSILECTOMY, ADENOIDECTOMY, BILATERAL MYRINGOTOMY AND TUBES    ? VASECTOMY    ? ? ?Family History  ?Problem Relation Age of Onset  ? Hypertension Mother   ? Diabetes Mother   ? Hypertension Father   ? Prostate cancer Paternal Uncle   ? ? ?Social History  ? ?Socioeconomic History  ? Marital status: Married  ?  Spouse name: Not on file  ? Number of children: Not on file  ? Years of education: Not on file  ? Highest education level: Not on file  ?Occupational History  ? Occupation: printing   ?Tobacco Use  ? Smoking status: Never  ? Smokeless tobacco: Never  ?Vaping Use  ? Vaping Use: Never used  ?Substance and Sexual Activity  ? Alcohol use: Yes  ?  Alcohol/week: 2.0 - 3.0 standard drinks  ?  Types: 2 - 3 Cans of beer per week  ?  Comment: occ.  ? Drug use: No  ? Sexual activity: Yes  ?Other Topics Concern  ? Not on file  ?Social History Narrative  ? Not on file  ? ?Social Determinants of Health  ? ?Financial Resource Strain: Not on file  ?Food Insecurity: Not on file  ?Transportation Needs: Not on file  ?Physical Activity: Not on file  ?Stress: Not on file  ?Social Connections: Not on file  ?Intimate Partner Violence: Not on file  ? ? ?ROS ?As per HPI ?  ? ? ?Objective   ? ?BP (!) 183/110 (BP Location: Left Arm, Patient Position: Sitting, Cuff Size: Normal)   Pulse 96   Temp (!) 97.3 ?F (36.3 ?C) (Temporal)   Ht '5\' 10"'  (5.929 m)   Wt (!) 300 lb 3.2 oz (136.2 kg)   SpO2 97%   BMI 43.07 kg/m?  ? ?Gen: NAD, resting comfortably ?CV: RRR with no murmurs appreciated ?Pulm: NWOB, CTAB with no crackles, wheezes, or rhonchi ?GI: Normal bowel sounds present. Soft, Nontender, Nondistended. ?MSK: Trace lower extremity edema, cyanosis, or clubbing noted ?Skin: Multiple nevi and freckles along  arms ?Neuro: grossly normal, moves all extremities ?Psych: Normal affect and thought content ? ? ? ?  ? ?Assessment & Plan:  ? ?Problem List Items Addressed This Visit   ? ?  ? Cardiovascular and Mediastinum  ? HTN (hypertension), benign  ?  Chronic, uncontrolled ?Has been without symptoms medication ?Restart hydrochlorothiazide ?Take home blood pressures ?Follow-up in 1 month ? ?  ?  ? Relevant Medications  ? losartan (COZAAR) 50 MG tablet  ? labetalol (NORMODYNE) 200 MG tablet  ? hydrochlorothiazide (HYDRODIURIL) 12.5 MG tablet  ? Other Relevant Orders  ? Hemoglobin A1c (Completed)  ? Lipid Profile (Completed)  ? Comp Met (CMET) (Completed)  ?  ? Respiratory  ? Allergic rhinitis  ?  Stable, chronic, ?Controlled on montelukast and loratadine ? ?  ?  ? Asthma  ?  Stable, controlled on Symbicort and as needed albuterol ? ?  ?  ? Relevant Medications  ? montelukast (SINGULAIR) 10 MG tablet  ?  ? Digestive  ? GERD (gastroesophageal reflux disease)  ?  Stable,  ?No active symptoms, has been on PPIs in the past ?Continue to monitor ? ?  ?  ?  ? Endocrine  ? Type 2 diabetes mellitus without complication (HCC)  ?  Hemoglobin A1c approximately 9 months ago was 6.5 ?Recommend diet and exercise ?Recheck hemoglobin A1c ? ?  ?  ? Relevant Medications  ? losartan (COZAAR) 50 MG tablet  ? Other Relevant Orders  ? Hemoglobin A1c (Completed)  ? Lipid Profile (Completed)  ? Comp Met (CMET) (Completed)  ?  ? Other  ? Low testosterone - Primary  ?  Check testosterone levels ? ?  ?  ? Relevant Orders  ? Testosterone (Completed)  ? Erectile dysfunction  ?  Has been on Viagra in the past, this did not help ?We will work on lifestyle modifications and lower blood pressure and reassess after this ? ?  ?  ? BMI 40.0-44.9, adult ([place])  ?  Encourage diet and exercise ?Restratification labs CMP, lipid, hemoglobin A1c ? ?  ?  ? Relevant Orders  ? Hemoglobin A1c (Completed)  ? Lipid Profile (Completed)  ? Comp Met (CMET) (Completed)  ?  Hyperlipidemia  ?  Check lipid panel ? ? ?  ?  ? Relevant Medications  ? losartan (COZAAR) 50 MG tablet  ? labetalol (NORMODYNE) 200 MG tablet  ? hydrochlorothiazide (HYDRODIURIL) 12.5 MG tablet  ? Other Relevant Orders  ? Hemoglobin A1c

## 2021-07-21 NOTE — Assessment & Plan Note (Signed)
Stable, controlled on Symbicort and as needed albuterol ?

## 2021-07-21 NOTE — Assessment & Plan Note (Signed)
Has been on Viagra in the past, this did not help ?We will work on lifestyle modifications and lower blood pressure and reassess after this ?

## 2021-07-21 NOTE — Patient Instructions (Signed)
We are checking routine labs today as discussed. ?We are getting a testosterone today. ?We are restarting hydrochlorothiazide for blood pressure. ?Please check your blood pressure 3 times a week.  Follow-up in 1 month for blood pressure check ?

## 2021-07-21 NOTE — Assessment & Plan Note (Signed)
Check testosterone levels.  

## 2021-07-21 NOTE — Assessment & Plan Note (Signed)
Stable, chronic, ?Controlled on montelukast and loratadine ?

## 2021-07-21 NOTE — Assessment & Plan Note (Signed)
Encourage diet and exercise ?Restratification labs CMP, lipid, hemoglobin A1c ?

## 2021-07-21 NOTE — Assessment & Plan Note (Signed)
Chronic, uncontrolled ?Has been without symptoms medication ?Restart hydrochlorothiazide ?Take home blood pressures ?Follow-up in 1 month ?

## 2021-07-21 NOTE — Assessment & Plan Note (Signed)
Stable,  ?No active symptoms, has been on PPIs in the past ?Continue to monitor ?

## 2021-07-21 NOTE — Assessment & Plan Note (Signed)
Check lipid panel  

## 2021-07-22 LAB — COMPREHENSIVE METABOLIC PANEL
ALT: 24 U/L (ref 0–53)
AST: 16 U/L (ref 0–37)
Albumin: 4.3 g/dL (ref 3.5–5.2)
Alkaline Phosphatase: 72 U/L (ref 39–117)
BUN: 14 mg/dL (ref 6–23)
CO2: 27 mEq/L (ref 19–32)
Calcium: 10.2 mg/dL (ref 8.4–10.5)
Chloride: 101 mEq/L (ref 96–112)
Creatinine, Ser: 1.28 mg/dL (ref 0.40–1.50)
GFR: 62.64 mL/min (ref >60.00)
Glucose, Bld: 215 mg/dL — ABNORMAL HIGH (ref 70–99)
Potassium: 4.2 mEq/L (ref 3.5–5.1)
Sodium: 138 mEq/L (ref 135–145)
Total Bilirubin: 0.7 mg/dL (ref 0.2–1.2)
Total Protein: 6.6 g/dL (ref 6.0–8.3)

## 2021-07-22 LAB — LIPID PANEL
Cholesterol: 184 mg/dL (ref 0–200)
HDL: 35.1 mg/dL — ABNORMAL LOW (ref >39.00)
NonHDL: 149.02
Total CHOL/HDL Ratio: 5
Triglycerides: 272 mg/dL — ABNORMAL HIGH (ref 0.0–149.0)
VLDL: 54.4 mg/dL — ABNORMAL HIGH (ref 0.0–40.0)

## 2021-07-22 LAB — PSA: PSA: 0.42 ng/mL (ref 0.10–4.00)

## 2021-07-22 LAB — HEMOGLOBIN A1C: Hgb A1c MFr Bld: 8.4 % — ABNORMAL HIGH (ref 4.6–6.5)

## 2021-07-22 LAB — TESTOSTERONE: Testosterone: 86.02 ng/dL — ABNORMAL LOW (ref 300.00–890.00)

## 2021-07-22 LAB — LDL CHOLESTEROL, DIRECT: Direct LDL: 130 mg/dL

## 2021-07-30 ENCOUNTER — Encounter (HOSPITAL_BASED_OUTPATIENT_CLINIC_OR_DEPARTMENT_OTHER): Payer: Self-pay

## 2021-07-30 ENCOUNTER — Other Ambulatory Visit: Payer: Self-pay

## 2021-07-30 ENCOUNTER — Emergency Department (HOSPITAL_BASED_OUTPATIENT_CLINIC_OR_DEPARTMENT_OTHER): Payer: Commercial Managed Care - HMO | Admitting: Radiology

## 2021-07-30 ENCOUNTER — Emergency Department (HOSPITAL_BASED_OUTPATIENT_CLINIC_OR_DEPARTMENT_OTHER)
Admission: EM | Admit: 2021-07-30 | Discharge: 2021-07-31 | Disposition: A | Discharge status: Home / Self Care | Payer: Commercial Managed Care - HMO | Attending: Emergency Medicine | Admitting: Emergency Medicine

## 2021-07-30 DIAGNOSIS — Z79899 Other long term (current) drug therapy: Secondary | ICD-10-CM | POA: Insufficient documentation

## 2021-07-30 DIAGNOSIS — R519 Headache, unspecified: Secondary | ICD-10-CM | POA: Diagnosis not present

## 2021-07-30 DIAGNOSIS — R079 Chest pain, unspecified: Secondary | ICD-10-CM | POA: Diagnosis not present

## 2021-07-30 DIAGNOSIS — R202 Paresthesia of skin: Secondary | ICD-10-CM | POA: Insufficient documentation

## 2021-07-30 DIAGNOSIS — R0602 Shortness of breath: Secondary | ICD-10-CM | POA: Insufficient documentation

## 2021-07-30 DIAGNOSIS — I10 Essential (primary) hypertension: Secondary | ICD-10-CM

## 2021-07-30 LAB — CBC WITH DIFFERENTIAL/PLATELET
Abs Immature Granulocytes: 0.03 10*3/uL (ref 0.00–0.07)
Basophils Absolute: 0 10*3/uL (ref 0.0–0.1)
Basophils Relative: 1 %
Eosinophils Absolute: 0.3 10*3/uL (ref 0.0–0.5)
Eosinophils Relative: 4 %
HCT: 41.7 % (ref 39.0–52.0)
Hemoglobin: 13.3 g/dL (ref 13.0–17.0)
Immature Granulocytes: 0 %
Lymphocytes Relative: 30 %
Lymphs Abs: 2.4 10*3/uL (ref 0.7–4.0)
MCH: 25.5 pg — ABNORMAL LOW (ref 26.0–34.0)
MCHC: 31.9 g/dL (ref 30.0–36.0)
MCV: 80 fL (ref 80.0–100.0)
Monocytes Absolute: 0.7 10*3/uL (ref 0.1–1.0)
Monocytes Relative: 8 %
Neutro Abs: 4.7 10*3/uL (ref 1.7–7.7)
Neutrophils Relative %: 57 %
Platelets: 215 10*3/uL (ref 150–400)
RBC: 5.21 MIL/uL (ref 4.22–5.81)
RDW: 13.8 % (ref 11.5–15.5)
WBC: 8.2 10*3/uL (ref 4.0–10.5)
nRBC: 0 % (ref 0.0–0.2)

## 2021-07-30 LAB — COMPREHENSIVE METABOLIC PANEL
ALT: 26 U/L (ref 0–44)
AST: 16 U/L (ref 15–41)
Albumin: 4.2 g/dL (ref 3.5–5.0)
Alkaline Phosphatase: 55 U/L (ref 38–126)
Anion gap: 8 (ref 5–15)
BUN: 18 mg/dL (ref 6–20)
CO2: 28 mmol/L (ref 22–32)
Calcium: 9.4 mg/dL (ref 8.9–10.3)
Chloride: 102 mmol/L (ref 98–111)
Creatinine, Ser: 0.96 mg/dL (ref 0.61–1.24)
GFR, Estimated: >60 mL/min (ref >60)
Glucose, Bld: 191 mg/dL — ABNORMAL HIGH (ref 70–99)
Potassium: 3.5 mmol/L (ref 3.5–5.1)
Sodium: 138 mmol/L (ref 135–145)
Total Bilirubin: 0.5 mg/dL (ref 0.3–1.2)
Total Protein: 6.4 g/dL — ABNORMAL LOW (ref 6.5–8.1)

## 2021-07-30 LAB — CBG MONITORING, ED: Glucose-Capillary: 190 mg/dL — ABNORMAL HIGH (ref 70–99)

## 2021-07-30 LAB — D-DIMER, QUANTITATIVE: D-Dimer, Quant: 0.37 ug/mL-FEU (ref 0.00–0.50)

## 2021-07-30 MED ORDER — ACETAMINOPHEN 500 MG PO TABS
1000.0000 mg | ORAL_TABLET | Freq: Once | ORAL | Status: AC
  Administered 2021-07-30: 1000 mg via ORAL
  Filled 2021-07-30: qty 2

## 2021-07-30 MED ORDER — LACTATED RINGERS IV BOLUS
1000.0000 mL | Freq: Once | INTRAVENOUS | Status: AC
  Administered 2021-07-30: 1000 mL via INTRAVENOUS

## 2021-07-30 NOTE — ED Provider Notes (Signed)
?[facility] DEPT ?Provider Note ? ? ?CSN: [id number] ?Arrival date & time: 07/30/21  2059 ? ?  ? ?History ? ?Chief Complaint  ?Patient presents with  ? Shortness of Breath  ? ? ?Larence Thone. is a 56 y.o. male. ? ?HPI ?56 year old male presents with a chief complaint of feeling "lousy".  He states that this evening after supper he felt headache that has come and gone ever since.  He felt tingling in the fingertips of both hands.  He is also had some dull chest pain and it feels like it worsens with inspiration.  He has a hard time describing his symptoms, primarily just does not feel well.  Is concerned that this is related to his blood pressure as he has felt poorly with blood pressure before.  Typically when he checks it it is 140/90.  He recently had a new doctor visit because of changing insurance and they adjusted his meds some.  Right now his headache is moderate.  Was never severe.  No vision changes or vomiting. ? ?Home Medications ?Prior to Admission medications   ?Medication Sig Start Date End Date Taking? Authorizing Provider  ?albuterol (PROAIR HFA) 108 (90 Base) MCG/ACT inhaler INHALE TWO PUFFS INTO THE LUNGS EVERY 6 HOURS AS NEEDED FOR WHEEZING 03/09/19   Midge Minium, MD  ?budesonide-formoterol [facility]) 80-4.5 MCG/ACT inhaler INHALE TWO PUFFS INTO THE LUNGS TWICE A DAY 03/09/19   Midge Minium, MD  ?hydrochlorothiazide (HYDRODIURIL) 12.5 MG tablet Take 1 tablet (12.5 mg total) by mouth daily. 07/21/21   Bonnita Hollow, MD  ?labetalol (NORMODYNE) 200 MG tablet Take by mouth. 10/15/20   [provider]  ?lamoTRIgine (LAMICTAL) 25 MG tablet Take 4 tablets (100 mg total) by mouth 2 (two) times daily. 07/21/21 10/19/21  Bonnita Hollow, MD  ?loratadine (CLARITIN) 10 MG tablet Take 10 mg by mouth daily.    [provider]  ?losartan (COZAAR) 50 MG tablet Take 1 tablet by mouth daily. 07/04/21   [provider]  ?montelukast (SINGULAIR) 10 MG  tablet Take 1 tablet by mouth daily. 06/08/19   [provider]  ?sildenafil (VIAGRA) 100 MG tablet TAKE 1/2 TO 1 TABLET BY MOUTH AS NEEDED FOR ERECTILE DYSFUNCTION 01/29/20   Midge Minium, MD  ?   ? ?Allergies    ?Levetiracetam   ? ?Review of Systems   ?Review of Systems  ?Eyes:  Negative for visual disturbance.  ?Respiratory:  Negative for shortness of breath.   ?Cardiovascular:  Positive for chest pain.  ?Gastrointestinal:  Negative for abdominal pain and vomiting.  ?Genitourinary:  Negative for dysuria.  ?Neurological:  Positive for headaches.  ? ?Physical Exam ?Updated Vital Signs ?BP (!) 168/91   Pulse 86   Temp 98.8 ?F (37.1 ?C)   Resp 16   Ht '5\' 10"'$  (1.778 m)   Wt 130.6 kg   SpO2 97%   BMI 41.32 kg/m?  ?Physical Exam ?Vitals and nursing note reviewed.  ?Constitutional:   ?   Appearance: He is well-developed. He is obese.  ?HENT:  ?   Head: Normocephalic and atraumatic.  ?Cardiovascular:  ?   Rate and Rhythm: Normal rate and regular rhythm.  ?   Heart sounds: Normal heart sounds.  ?Pulmonary:  ?   Effort: Pulmonary effort is normal.  ?   Breath sounds: Normal breath sounds. No wheezing.  ?Abdominal:  ?   Palpations: Abdomen is soft.  ?   Tenderness: There is no abdominal tenderness.  ?  Skin: ?   General: Skin is warm and dry.  ?Neurological:  ?   Mental Status: He is alert.  ?   Comments: CN 3-12 grossly intact. 5/5 strength in all 4 extremities. Grossly normal sensation. Normal finger to nose. Normal gait  ? ? ?ED Results / Procedures / Treatments   ?Labs ?(all labs ordered are listed, but only abnormal results are displayed) ?Labs Reviewed  ?COMPREHENSIVE METABOLIC PANEL - Abnormal; Notable for the following components:  ?    Result Value  ? Glucose, Bld 191 (*)   ? Total Protein 6.4 (*)   ? All other components within normal limits  ?CBC WITH DIFFERENTIAL/PLATELET - Abnormal; Notable for the following components:  ? MCH 25.5 (*)   ? All other components within normal limits  ?CBG  MONITORING, ED - Abnormal; Notable for the following components:  ? Glucose-Capillary 190 (*)   ? All other components within normal limits  ?D-DIMER, QUANTITATIVE  ?TROPONIN I (HIGH SENSITIVITY)  ? ? ?EKG ?EKG Interpretation ? ?Date/Time:  Wednesday Jul 30 2021 22:53:08 EDT ?Ventricular Rate:  87 ?PR Interval:  178 ?QRS Duration: 85 ?QT Interval:  347 ?QTC Calculation: 418 ?R Axis:   84 ?Text Interpretation: Sinus rhythm Probable left atrial enlargement no acute ST/T changes similar to Mar 2020 Confirmed by Sherwood Gambler [phone]) on 07/30/2021 11:17:06 PM ? ?Radiology ?DG Chest 2 View ? ?Result Date: 07/30/2021 ?CLINICAL DATA:  Chest pain.  Shortness of breath EXAM: CHEST - 2 VIEW COMPARISON:  02/12/2014 FINDINGS: Lung volumes are low.The cardiomediastinal contours are normal. Subsegmental atelectasis in the lung bases. Pulmonary vasculature is normal. No pleural effusion or pneumothorax. No acute osseous abnormalities are seen. IMPRESSION: Low lung volumes with bibasilar atelectasis. Electronically Signed   By: Keith Rake M.D.   On: 07/30/2021 23:31   ? ?Procedures ?Procedures  ? ? ?Medications Ordered in ED ?Medications  ?lactated ringers bolus 1,000 mL (1,000 mLs Intravenous New Bag/Given 07/30/21 2302)  ?acetaminophen (TYLENOL) tablet 1,000 mg (1,000 mg Oral Given 07/30/21 2303)  ? ? ?ED Course/ Medical Decision Making/ A&P ?  ?                        ?Medical Decision Making ?Amount and/or Complexity of Data Reviewed ?Labs: ordered. ?Radiology: ordered. ? ?Risk ?OTC drugs. ? ? ?Patient with multiple vague symptoms though he is also having chest pain.  Given the pleuritic nature but otherwise low risk for PE, D-dimer was sent and is negative.  His ECG was reviewed/interpreted by myself and has no acute ischemia.  Other labs are unremarkable though his troponin is still pending.  Chest x-ray viewed by myself and no pneumonia.  At this point, he will probably need a second troponin assuming the first is  negative.  If both are negative I think he can be discharged to follow-up with his PCP. Care to Dr. Christy Gentles. ? ? ? ? ? ? ? ?Final Clinical Impression(s) / ED Diagnoses ?Final diagnoses:  ?None  ? ? ?Rx / DC Orders ?ED Discharge Orders   ? ? None  ? ?  ? ? ?  ?Sherwood Gambler, MD ?07/31/21 0000 ? ?

## 2021-07-30 NOTE — ED Triage Notes (Signed)
Pt arrives POV with his wife. ? ?States he just doesn't feel well, which started earlier this evening. ? ?He endorses headache, shortness of breath, and hands feeling "shaky". ? ?Denies chest pain, nausea, vomiting, diarrhea, fever. ? ?Pt ambulatory to triage, appears to not feel well but is in NAD, GCS 15.  ?

## 2021-07-30 NOTE — ED Notes (Signed)
RT spoke with pt about asthma diagnosis. Pt states he takes albuterol inhaler as needed and utilizes his symbicort the same. Pt states it is suppose to be taken 2 puffs day and night (BID), pt admits he only uses when he feels like he needs it. RT educated pt on the proper use of his symbicort inhaler and that it is a maintenance inhaler which only works when he takes it as directed by the pulmonologist. Pt verbalizes understanding and states he will start taking it properly. RT will continue to monitor. ?

## 2021-07-31 ENCOUNTER — Ambulatory Visit (INDEPENDENT_AMBULATORY_CARE_PROVIDER_SITE_OTHER): Payer: Commercial Managed Care - HMO | Admitting: Family Medicine

## 2021-07-31 VITALS — BP 169/106 | HR 90 | Temp 97.2°F | Ht 70.0 in | Wt 239.6 lb

## 2021-07-31 DIAGNOSIS — R0602 Shortness of breath: Secondary | ICD-10-CM | POA: Diagnosis not present

## 2021-07-31 DIAGNOSIS — E1165 Type 2 diabetes mellitus with hyperglycemia: Secondary | ICD-10-CM | POA: Diagnosis not present

## 2021-07-31 DIAGNOSIS — E1159 Type 2 diabetes mellitus with other circulatory complications: Secondary | ICD-10-CM | POA: Diagnosis not present

## 2021-07-31 DIAGNOSIS — I10 Essential (primary) hypertension: Secondary | ICD-10-CM

## 2021-07-31 DIAGNOSIS — Z6835 Body mass index (BMI) 35.0-35.9, adult: Secondary | ICD-10-CM | POA: Diagnosis not present

## 2021-07-31 LAB — TROPONIN I (HIGH SENSITIVITY)
Troponin I (High Sensitivity): 4 ng/L (ref <18)
Troponin I (High Sensitivity): 5 ng/L (ref <18)

## 2021-07-31 LAB — POCT GLUCOSE (DEVICE FOR HOME USE)
Glucose Fasting, POC: 114 mg/dL — AB (ref 70–99)
POC Glucose: 114 mg/dl — AB (ref 70–99)

## 2021-07-31 MED ORDER — LOSARTAN POTASSIUM-HCTZ 100-25 MG PO TABS: 1.0000 | ORAL_TABLET | Freq: Every day | ORAL | 0 refills | Status: DC

## 2021-07-31 MED ORDER — BLOOD GLUCOSE MONITOR KIT: PACK | 0 refills | Status: AC

## 2021-07-31 MED ORDER — RYBELSUS 3 MG PO TABS: 3.0000 mg | ORAL_TABLET | Freq: Every day | ORAL | 3 refills | Status: DC

## 2021-07-31 NOTE — ED Provider Notes (Signed)
I assumed care in signout to follow-up with troponin.  Repeat troponin is unremarkable.  Patient has been sleeping throughout his stay without any issues.  No new pain.  Blood pressure is improving.  Patient is safe for discharge home.  We discussed need for blood pressure monitoring and PCP follow-up.  We discussed strict return precaution ?  ?Ripley Fraise, MD ?07/31/21 0210 ? ?

## 2021-07-31 NOTE — Assessment & Plan Note (Addendum)
Improved ?Work-up in ED negative as discussed ?Likely multifactorial in setting of uncontrolled hypertension and diabetes ?Other things that could be contributing repeat OSA, heart failure ?Given rapid improvement in symptoms, will focus on controlling hypertension and improving diabetes ?Close follow-up with low threshold to get echocardiogram ?Consider sleep study if above work-up negative ?

## 2021-07-31 NOTE — Patient Instructions (Signed)
Try Rybelsus 3 mg for diabetes.  ?For blood pressure, increase losartan/hydrochlorothiazide to 100/25 mg.   ?

## 2021-07-31 NOTE — Assessment & Plan Note (Addendum)
Elevated  hemoglobin A1c at 8.4, ED CMP showed CBG 190, no electrolyte imbalance or acidosis ?POC glucose, fasting was ?Trialed Rybelsus 3 mg ?Follow-up 3 months ? ?

## 2021-07-31 NOTE — Progress Notes (Signed)
? ?Acute Office Visit ? ?Subjective:  ? ?  ?Patient ID: George Needle., male    DOB: 08/05/1965, 56 y.o.   MRN: [id number] ? ?Chief Complaint  ?Patient presents with  ? Acute Visit  ?  Follow up hospital visit due to BP c/o headache ?Would like to get blood sugar checked  ? ? ?HPI ?Patient is in today for high blood pressure.  Went to ED last night, for shortness of breath, chest pain, and high blood pressure.  He had full panel of labs that showed hyperglycemia on CMP with remainder normal, but negative D-dimer, negative chest x-ray, normal EKG, negative CBC.  Negative troponins x2.  Patient blood pressure was 160s over 90s. ?Patient reports there is mild headache, no endorsed polydipsia/polyuria, chest pain and shortness of breath have resolved. ? ?ROS ?As per HPI ? ?   ?Objective:  ?  ?BP (!) 169/106 (BP Location: Left Arm, Patient Position: Sitting, Cuff Size: Normal)   Pulse 90   Temp (!) 97.2 ?F (36.2 ?C) (Temporal)   Ht '5\' 10"'  (1.778 m)   Wt 239 lb 9.6 oz (108.7 kg)   SpO2 95%   BMI 34.38 kg/m?  ? ? ?Gen: NAD, resting comfortably ?CV: RRR with no murmurs appreciated ?Pulm: NWOB, CTAB with no crackles, wheezes, or rhonchi ?GI: Normal bowel sounds present. Soft, Nontender, Nondistended. ?MSK: no edema, cyanosis, or clubbing noted ?Skin: warm, dry ?Neuro: grossly normal, moves all extremities ?Psych: Normal affect and thought content ? ? ?Results for orders placed or performed during the hospital encounter of 07/30/21  ?Comprehensive metabolic panel  ?Result Value Ref Range  ? Sodium 138 135 - 145 mmol/L  ? Potassium 3.5 3.5 - 5.1 mmol/L  ? Chloride 102 98 - 111 mmol/L  ? CO2 28 22 - 32 mmol/L  ? Glucose, Bld 191 (H) 70 - 99 mg/dL  ? BUN 18 6 - 20 mg/dL  ? Creatinine, Ser 0.96 0.61 - 1.24 mg/dL  ? Calcium 9.4 8.9 - 10.3 mg/dL  ? Total Protein 6.4 (L) 6.5 - 8.1 g/dL  ? Albumin 4.2 3.5 - 5.0 g/dL  ? AST 16 15 - 41 U/L  ? ALT 26 0 - 44 U/L  ? Alkaline Phosphatase 55 38 - 126 U/L  ? Total Bilirubin 0.5 0.3 -  1.2 mg/dL  ? GFR, Estimated >60 >60 mL/min  ? Anion gap 8 5 - 15  ?CBC with Differential  ?Result Value Ref Range  ? WBC 8.2 4.0 - 10.5 K/uL  ? RBC 5.21 4.22 - 5.81 MIL/uL  ? Hemoglobin 13.3 13.0 - 17.0 g/dL  ? HCT 41.7 39.0 - 52.0 %  ? MCV 80.0 80.0 - 100.0 fL  ? MCH 25.5 (L) 26.0 - 34.0 pg  ? MCHC 31.9 30.0 - 36.0 g/dL  ? RDW 13.8 11.5 - 15.5 %  ? Platelets 215 150 - 400 K/uL  ? nRBC 0.0 0.0 - 0.2 %  ? Neutrophils Relative % 57 %  ? Neutro Abs 4.7 1.7 - 7.7 K/uL  ? Lymphocytes Relative 30 %  ? Lymphs Abs 2.4 0.7 - 4.0 K/uL  ? Monocytes Relative 8 %  ? Monocytes Absolute 0.7 0.1 - 1.0 K/uL  ? Eosinophils Relative 4 %  ? Eosinophils Absolute 0.3 0.0 - 0.5 K/uL  ? Basophils Relative 1 %  ? Basophils Absolute 0.0 0.0 - 0.1 K/uL  ? Immature Granulocytes 0 %  ? Abs Immature Granulocytes 0.03 0.00 - 0.07 K/uL  ?D-dimer, quantitative  ?Result  Value Ref Range  ? D-Dimer, Quant 0.37 0.00 - 0.50 ug/mL-FEU  ?CBG monitoring, ED  ?Result Value Ref Range  ? Glucose-Capillary 190 (H) 70 - 99 mg/dL  ?Troponin I (High Sensitivity)  ?Result Value Ref Range  ? Troponin I (High Sensitivity) 4 <18 ng/L  ?Troponin I (High Sensitivity)  ?Result Value Ref Range  ? Troponin I (High Sensitivity) 5 <18 ng/L  ? ? ? ?   ?Assessment & Plan:  ? ?Problem List Items Addressed This Visit   ? ?  ? Cardiovascular and Mediastinum  ? Hypertension associated with diabetes ([place]) - Primary  ?  Uncontrolled ?Borderline symptomatic as above, but negative ED work-up ?Increase losartan to 100 mg and HCTZ to 25 mg ?Follow-up 1 month ? ? ?  ?  ? Relevant Medications  ? losartan-hydrochlorothiazide (HYZAAR) 100-25 MG tablet  ? Semaglutide (RYBELSUS) 3 MG TABS  ?  ? Endocrine  ? Type 2 diabetes mellitus with hyperglycemia, without long-term current use of insulin ([place])  ?  Elevated  hemoglobin A1c at 8.4, ED CMP showed CBG 190, no electrolyte imbalance or acidosis ?POC glucose, fasting was ?Trialed Rybelsus 3 mg ?Follow-up 3 months ? ? ?  ?  ? Relevant  Medications  ? losartan-hydrochlorothiazide (HYZAAR) 100-25 MG tablet  ? Semaglutide (RYBELSUS) 3 MG TABS  ? blood glucose meter kit and supplies KIT  ? Other Relevant Orders  ? POCT Glucose (Device for Home Use)  ?  ? Other  ? BMI 35.0-35.9,adult  ?  Down 7 pounds, congratulated, encourage low-carb, low-fat, diet and continue exercise ?Follow weight loss on Rybelsus ? ?  ?  ? Shortness of breath  ?  Improved ?Work-up in ED negative as discussed ?Likely multifactorial in setting of uncontrolled hypertension and diabetes ?Other things that could be contributing repeat OSA, heart failure ?Given rapid improvement in symptoms, will focus on controlling hypertension and improving diabetes ?Close follow-up with low threshold to get echocardiogram ?Consider sleep study if above work-up negative ? ?  ?  ? ?Other Visit Diagnoses   ? ? Morbid obesity ([place])      ? Relevant Medications  ? Semaglutide (RYBELSUS) 3 MG TABS  ? ?  ? ? ?Meds ordered this encounter  ?Medications  ? losartan-hydrochlorothiazide (HYZAAR) 100-25 MG tablet  ?  Sig: Take 1 tablet by mouth daily.  ?  Dispense:  90 tablet  ?  Refill:  0  ? Semaglutide (RYBELSUS) 3 MG TABS  ?  Sig: Take 3 mg by mouth daily.  ?  Dispense:  30 tablet  ?  Refill:  3  ? blood glucose meter kit and supplies KIT  ?  Sig: Dispense based on patient and insurance preference. Use up to four times daily as directed.  ?  Dispense:  1 each  ?  Refill:  0  ?  Order Specific Question:   Number of strips  ?  Answer:   90  ?  Order Specific Question:   Number of lancets  ?  Answer:   90  ? ? ?Return in about 3 months (around 10/31/2021) for BP, DM. ? ?Bonnita Hollow, MD ? ? ?

## 2021-07-31 NOTE — Assessment & Plan Note (Signed)
Down 7 pounds, congratulated, encourage low-carb, low-fat, diet and continue exercise ?Follow weight loss on Rybelsus ?

## 2021-07-31 NOTE — ED Notes (Signed)
Pt verbalizes understanding of discharge instructions. Opportunity for questioning and answers were provided. Pt discharged from ED to home.   ? ?

## 2021-07-31 NOTE — Assessment & Plan Note (Signed)
Uncontrolled ?Borderline symptomatic as above, but negative ED work-up ?Increase losartan to 100 mg and HCTZ to 25 mg ?Follow-up 1 month ? ?

## 2021-07-31 NOTE — Assessment & Plan Note (Signed)
Patient working on diet ?Weight down 7 pounds ?Continue to encourage weight loss and healthy lifestyles ? ?

## 2021-08-05 ENCOUNTER — Telehealth: Payer: Self-pay | Admitting: Family Medicine

## 2021-08-05 DIAGNOSIS — E1165 Type 2 diabetes mellitus with hyperglycemia: Secondary | ICD-10-CM

## 2021-08-05 MED ORDER — METFORMIN HCL 1000 MG PO TABS: 1000.0000 mg | ORAL_TABLET | Freq: Two times a day (BID) | ORAL | 3 refills | Status: DC

## 2021-08-05 NOTE — Telephone Encounter (Signed)
Rybelsus not covered. Would need to try metformin or an injectable alternative. Which does he prefer?

## 2021-08-05 NOTE — Telephone Encounter (Signed)
Pt called stating pharmacy advised Semaglutide (RYBELSUS) 3 MG TABS  requires prior auth. Please submit and f/u with pt ? ?[phone] ? ?Uses [employer] ?

## 2021-08-20 ENCOUNTER — Telehealth: Payer: Self-pay

## 2021-08-20 ENCOUNTER — Ambulatory Visit: Payer: Managed Care, Other (non HMO) | Admitting: Family Medicine

## 2021-08-20 NOTE — Telephone Encounter (Signed)
[name] from [facility] is calling regarding a prior authorization.  He is asking for a call back at [phone] Ref# [facility]

## 2021-08-21 NOTE — Telephone Encounter (Signed)
Spoke with Alison Murray with [employer] to resubmit Pt Rybelsus may take a week to summit through.

## 2021-10-19 ENCOUNTER — Encounter: Payer: Self-pay | Admitting: Family Medicine

## 2021-10-30 ENCOUNTER — Ambulatory Visit: Payer: Commercial Managed Care - HMO | Admitting: Family Medicine

## 2021-10-31 ENCOUNTER — Telehealth: Payer: Self-pay

## 2021-10-31 ENCOUNTER — Other Ambulatory Visit (INDEPENDENT_AMBULATORY_CARE_PROVIDER_SITE_OTHER): Payer: Commercial Managed Care - HMO

## 2021-10-31 ENCOUNTER — Other Ambulatory Visit: Payer: Commercial Managed Care - HMO

## 2021-10-31 DIAGNOSIS — R739 Hyperglycemia, unspecified: Secondary | ICD-10-CM | POA: Diagnosis not present

## 2021-10-31 DIAGNOSIS — R7309 Other abnormal glucose: Secondary | ICD-10-CM

## 2021-10-31 DIAGNOSIS — R7989 Other specified abnormal findings of blood chemistry: Secondary | ICD-10-CM | POA: Diagnosis not present

## 2021-10-31 DIAGNOSIS — E7800 Pure hypercholesterolemia, unspecified: Secondary | ICD-10-CM | POA: Diagnosis not present

## 2021-10-31 LAB — COMPREHENSIVE METABOLIC PANEL
ALT: 25 U/L (ref 0–53)
AST: 16 U/L (ref 0–37)
Albumin: 4.4 g/dL (ref 3.5–5.2)
Alkaline Phosphatase: 61 U/L (ref 39–117)
BUN: 14 mg/dL (ref 6–23)
CO2: 28 mEq/L (ref 19–32)
Calcium: 9.1 mg/dL (ref 8.4–10.5)
Chloride: 103 mEq/L (ref 96–112)
Creatinine, Ser: 0.98 mg/dL (ref 0.40–1.50)
GFR: 86.13 mL/min (ref >60.00)
Glucose, Bld: 136 mg/dL — ABNORMAL HIGH (ref 70–99)
Potassium: 4.2 mEq/L (ref 3.5–5.1)
Sodium: 140 mEq/L (ref 135–145)
Total Bilirubin: 1 mg/dL (ref 0.2–1.2)
Total Protein: 6.7 g/dL (ref 6.0–8.3)

## 2021-10-31 LAB — LIPID PANEL
Cholesterol: 181 mg/dL (ref 0–200)
HDL: 36.5 mg/dL — ABNORMAL LOW (ref >39.00)
LDL Cholesterol: 123 mg/dL — ABNORMAL HIGH (ref 0–99)
NonHDL: 144.73
Total CHOL/HDL Ratio: 5
Triglycerides: 109 mg/dL (ref 0.0–149.0)
VLDL: 21.8 mg/dL (ref 0.0–40.0)

## 2021-10-31 LAB — TESTOSTERONE: Testosterone: 154.96 ng/dL — ABNORMAL LOW (ref 300.00–890.00)

## 2021-10-31 LAB — HEMOGLOBIN A1C: Hgb A1c MFr Bld: 7.6 % — ABNORMAL HIGH (ref 4.6–6.5)

## 2021-10-31 NOTE — Telephone Encounter (Signed)
Pt arrived for lab appointment no orders in spoke with RN supervisor Asencion Partridge, who gave Verbal consent to enter orders for draw lon patient based off of previous labs. Due to absence of provider (Dr.Thompson) in office.   Lab/Dx codes are as followed:   CMP: elevated glucose  A1C: elevated glucose   LIPID: elevated cholesterol   TESTOSTERONE: low testosterone

## 2021-11-04 ENCOUNTER — Ambulatory Visit (INDEPENDENT_AMBULATORY_CARE_PROVIDER_SITE_OTHER): Payer: Commercial Managed Care - HMO | Admitting: Family Medicine

## 2021-11-04 ENCOUNTER — Encounter: Payer: Self-pay | Admitting: Family Medicine

## 2021-11-04 VITALS — BP 138/88 | HR 75 | Temp 97.5°F | Wt 288.4 lb

## 2021-11-04 DIAGNOSIS — E1165 Type 2 diabetes mellitus with hyperglycemia: Secondary | ICD-10-CM | POA: Diagnosis not present

## 2021-11-04 DIAGNOSIS — I152 Hypertension secondary to endocrine disorders: Secondary | ICD-10-CM | POA: Diagnosis not present

## 2021-11-04 DIAGNOSIS — E1159 Type 2 diabetes mellitus with other circulatory complications: Secondary | ICD-10-CM | POA: Diagnosis not present

## 2021-11-04 DIAGNOSIS — G4730 Sleep apnea, unspecified: Secondary | ICD-10-CM | POA: Diagnosis not present

## 2021-11-04 NOTE — Assessment & Plan Note (Signed)
Patient reports history of sleep apnea on prior sleep study STOP-BANG high risk Refer for home sleep study

## 2021-11-04 NOTE — Progress Notes (Signed)
Assessment/Plan:   Problem List Items Addressed This Visit       Endocrine   Type 2 diabetes mellitus with hyperglycemia, without long-term current use of insulin ([facility]) - Primary       Subjective:  HPI:  [name]. is a 56 y.o. male who has Allergic rhinitis; Asthma; Low testosterone; BMI 35.0-35.9,adult; GERD (gastroesophageal reflux disease); Hypertension associated with diabetes ([place]); Erectile dysfunction; Hyperlipidemia associated with type 2 diabetes mellitus ([place]); Type 2 diabetes mellitus with hyperglycemia, without long-term current use of insulin ([place]); and Shortness of breath on their problem list..   He  has a past medical history of Anxiety and depression (05/17/2017), Asthma, Atypical nevus of back (06/27/2014), Current mild episode of major depressive disorder without prior episode ([place]) (01/21/2020), Decreased libido (10/31/2012), Gout, GOUT (05/24/2007), H/O vasectomy, Hypertension, Insomnia (02/08/2015), NEOPLASM, SKIN, UNCERTAIN BEHAVIOR (1/0/2725), and Polyarthritis (12/28/2016).Marland Kitchen   He presents with chief complaint of Follow-up (3 month follow up b/p and dm. Patient is fasting. ) .   Hypertension, established problem, Stable BP Readings from Last 3 Encounters:  11/04/21 138/88  07/31/21 (!) 169/106  07/31/21 (!) 149/90   Home BP monitoring: 140s/90s  Current Medications: losartan/hctz 100 mg/25 mg, compliant without side effects. Interim History:   ROS: Denies any chest pain, shortness of breath, dyspnea on exertion, leg edema.    DIABETES Type II, established problem,  Medications: metformin 1000 mg bid, Reports stopped due to abdominal cramping, diarrhea, and back pain side effects. Blood Sugars per patient: Fasting: 130s Diet-low carb Regular Exercise-painting, yard work Interim History-   ROS: Denies Polyuria,Polydipsia, Denies  Hypoglycemia symptoms (palpitations, tremors, anxiousness)   Sleep Apnea:  He complains of snoring, snorting, periods of  not breathing, impotence, excessive daytime sleepiness.  Symptoms began several years ago, unchanged since that time. He denies . Previous evaluation and treatment has included  sleep study, in hospital 20 years ago . Never tried CPAP. Partner has witnessed no sleeping  Do you Snore Loudly (loud enough to be heard through closed doors or your bed-partner elbows you for snoring at night)? Yes  Do you often feel Tired, Fatigued, or Sleepy during the daytime (such as falling asleep during driving or talking to someone)? Yes  Has anyone Observed you Stop Breathing or Choking/Gasping during your sleep ? Yes  Do you have or are being treated for High Blood Pressure ? Yes  Body Mass Index more than 35 kg/m2? Body mass index is 41.38 kg/m.  Age older than 70?  56 y.o.  Neck size large ? (Measured around [place]) shirt collar >=16 inches / >=40cm larger? Not assessed  Gender = Male ? male  Score: 7 STOP-BANG Risk 0-2 Low risk for moderate to severe OSA 3-4 Intermediate risk for moderate to severe OSA 5-8 High risk for moderate to severe OSA     Past Surgical History:  Procedure Laterality Date   ANTERIOR CRUCIATE LIGAMENT REPAIR     TONSILECTOMY, ADENOIDECTOMY, BILATERAL MYRINGOTOMY AND TUBES     VASECTOMY      Outpatient Medications Prior to Visit  Medication Sig Dispense Refill   albuterol (PROAIR HFA) 108 (90 Base) MCG/ACT inhaler INHALE TWO PUFFS INTO THE LUNGS EVERY 6 HOURS AS NEEDED FOR WHEEZING 8.5 g 2   blood glucose meter kit and supplies KIT Dispense based on patient and insurance preference. Use up to four times daily as directed. 1 each 0   budesonide-formoterol (SYMBICORT) 80-4.5 MCG/ACT inhaler INHALE TWO PUFFS INTO THE LUNGS TWICE  A DAY 1 Inhaler 2   loratadine (CLARITIN) 10 MG tablet Take 10 mg by mouth daily.     losartan-hydrochlorothiazide (HYZAAR) 100-25 MG tablet Take 1 tablet by mouth daily. 90 tablet 0   montelukast (SINGULAIR) 10 MG tablet Take 1 tablet  by mouth daily.     sildenafil (VIAGRA) 100 MG tablet TAKE 1/2 TO 1 TABLET BY MOUTH AS NEEDED FOR ERECTILE DYSFUNCTION 10 tablet 2   lamoTRIgine (LAMICTAL) 25 MG tablet Take 4 tablets (100 mg total) by mouth 2 (two) times daily. 240 tablet 2   metFORMIN (GLUCOPHAGE) 1000 MG tablet Take 1 tablet (1,000 mg total) by mouth 2 (two) times daily with a meal. (Patient not taking: Reported on 11/04/2021) 180 tablet 3   Semaglutide (RYBELSUS) 3 MG TABS Take 3 mg by mouth daily. (Patient not taking: Reported on 11/04/2021) 30 tablet 3   No facility-administered medications prior to visit.    Family History  Problem Relation Age of Onset   Hypertension Mother    Diabetes Mother    Hypertension Father    Prostate cancer Paternal Uncle     Social History   Socioeconomic History   Marital status: Married    Spouse name: Not on file   Number of children: Not on file   Years of education: Not on file   Highest education level: Not on file  Occupational History   Occupation: printing   Tobacco Use   Smoking status: Never   Smokeless tobacco: Never  Vaping Use   Vaping Use: Never used  Substance and Sexual Activity   Alcohol use: Yes    Alcohol/week: 2.0 - 3.0 standard drinks of alcohol    Types: 2 - 3 Cans of beer per week    Comment: occ.   Drug use: No   Sexual activity: Yes  Other Topics Concern   Not on file  Social History Narrative   Not on file   Social Determinants of Health   Financial Resource Strain: Not on file  Food Insecurity: Not on file  Transportation Needs: Not on file  Physical Activity: Not on file  Stress: Not on file  Social Connections: Not on file  Intimate Partner Violence: Not on file                                                                                                 Objective:  Physical Exam: BP (!) 142/88 (BP Location: Left Arm, Patient Position: Sitting, Cuff Size: Large)   Pulse 75   Temp (!) 97.5 F (36.4 C) (Temporal)   Wt 288 lb  6.4 oz (130.8 kg)   SpO2 98%   BMI 41.38 kg/m    General: No acute distress. Awake and conversant.  Eyes: Normal conjunctiva, anicteric. Round symmetric pupils.  ENT: Hearing grossly intact. No nasal discharge.  Neck: Neck is supple. No masses or thyromegaly.  Respiratory: Respirations are non-labored. No auditory wheezing.  Skin: Warm. No rashes or ulcers.  Psych: Alert and oriented. Cooperative, Appropriate mood and affect, Normal judgment.  CV: No cyanosis or JVD MSK: Normal ambulation. No clubbing  Neuro: Sensation and CN II-XII grossly normal.        Alesia Banda, MD, MS

## 2021-11-04 NOTE — Assessment & Plan Note (Signed)
Hemoglobin A1c improved,  Insurance did not cover Rybelsus Patient does not want to try Ozempic due to concern about needles Did not tolerate metformin at maximum dose 1000 mg twice daily Rechallenge with metformin 1000 mg daily Patient has lost 10+ pounds, working on lifestyle modification Congratulated and encouraged about current lifestyle modifications

## 2021-11-04 NOTE — Assessment & Plan Note (Signed)
Stable Continue losartan 100 mg and HCTZ to 25 mg Follow-up  3  month

## 2021-11-04 NOTE — Patient Instructions (Signed)
Restart metformin at 1000 mg (1 pill a day)  Continue to work on diet and exercise

## 2021-11-21 ENCOUNTER — Other Ambulatory Visit: Payer: Self-pay | Admitting: Family Medicine

## 2021-11-21 DIAGNOSIS — I152 Hypertension secondary to endocrine disorders: Secondary | ICD-10-CM

## 2021-11-25 NOTE — Telephone Encounter (Signed)
Chart supports rx. Last OV: 11/04/2021 Next OV: 02/04/2022

## 2021-11-26 ENCOUNTER — Telehealth: Payer: Self-pay | Admitting: Family Medicine

## 2021-11-26 NOTE — Telephone Encounter (Signed)
Caller Name: illya Call back phone #: [phone]   MEDICATION(S):  losartan-hydrochlorothiazide (HYZAAR) 100-25 MG tablet  Days of Med Remaining:   Has the patient contacted their pharmacy (YES/NO)? y What did pharmacy advise? Pt stated that the pharmacy need a authorization  Preferred Pharmacy:  [facility] [place] Phone:  [phone]      ~~~Please advise patient/caregiver to allow 2-3 business days to process RX refills.

## 2021-11-26 NOTE — Telephone Encounter (Signed)
Left patient a detailed voice message regarding annotation below.  

## 2021-11-26 NOTE — Telephone Encounter (Signed)
Spoke with pharmacist and she stated that his prescription is available for pick up.   Left patient a detailed voice message to return call to office.

## 2021-11-26 NOTE — Telephone Encounter (Signed)
Please give pt a cb.  

## 2022-01-29 ENCOUNTER — Other Ambulatory Visit: Payer: Commercial Managed Care - HMO

## 2022-02-04 ENCOUNTER — Ambulatory Visit: Payer: Commercial Managed Care - HMO | Admitting: Family Medicine

## 2022-02-06 ENCOUNTER — Other Ambulatory Visit: Payer: Commercial Managed Care - HMO

## 2022-02-10 ENCOUNTER — Ambulatory Visit (INDEPENDENT_AMBULATORY_CARE_PROVIDER_SITE_OTHER): Payer: Commercial Managed Care - HMO | Admitting: Family Medicine

## 2022-02-10 ENCOUNTER — Encounter: Payer: Self-pay | Admitting: Family Medicine

## 2022-02-10 VITALS — BP 138/88 | HR 78 | Temp 97.4°F | Wt 284.8 lb

## 2022-02-10 DIAGNOSIS — E785 Hyperlipidemia, unspecified: Secondary | ICD-10-CM

## 2022-02-10 DIAGNOSIS — E1159 Type 2 diabetes mellitus with other circulatory complications: Secondary | ICD-10-CM

## 2022-02-10 DIAGNOSIS — E1165 Type 2 diabetes mellitus with hyperglycemia: Secondary | ICD-10-CM | POA: Diagnosis not present

## 2022-02-10 DIAGNOSIS — E1169 Type 2 diabetes mellitus with other specified complication: Secondary | ICD-10-CM

## 2022-02-10 DIAGNOSIS — J45909 Unspecified asthma, uncomplicated: Secondary | ICD-10-CM | POA: Diagnosis not present

## 2022-02-10 DIAGNOSIS — E291 Testicular hypofunction: Secondary | ICD-10-CM

## 2022-02-10 DIAGNOSIS — I152 Hypertension secondary to endocrine disorders: Secondary | ICD-10-CM

## 2022-02-10 DIAGNOSIS — R7989 Other specified abnormal findings of blood chemistry: Secondary | ICD-10-CM

## 2022-02-10 LAB — COMPREHENSIVE METABOLIC PANEL
ALT: 26 U/L (ref 0–53)
AST: 19 U/L (ref 0–37)
Albumin: 4.6 g/dL (ref 3.5–5.2)
Alkaline Phosphatase: 65 U/L (ref 39–117)
BUN: 15 mg/dL (ref 6–23)
CO2: 32 mEq/L (ref 19–32)
Calcium: 9.2 mg/dL (ref 8.4–10.5)
Chloride: 101 mEq/L (ref 96–112)
Creatinine, Ser: 0.9 mg/dL (ref 0.40–1.50)
GFR: 95.21 mL/min (ref >60.00)
Glucose, Bld: 99 mg/dL (ref 70–99)
Potassium: 3.8 mEq/L (ref 3.5–5.1)
Sodium: 140 mEq/L (ref 135–145)
Total Bilirubin: 1.4 mg/dL — ABNORMAL HIGH (ref 0.2–1.2)
Total Protein: 7.3 g/dL (ref 6.0–8.3)

## 2022-02-10 LAB — MICROALBUMIN / CREATININE URINE RATIO
Creatinine,U: 160.8 mg/dL
Microalb Creat Ratio: 1.4 mg/g (ref 0.0–30.0)
Microalb, Ur: 2.3 mg/dL — ABNORMAL HIGH (ref 0.0–1.9)

## 2022-02-10 LAB — POCT GLYCOSYLATED HEMOGLOBIN (HGB A1C): Hemoglobin A1C: 7 % — AB (ref 4.0–5.6)

## 2022-02-10 LAB — LIPID PANEL
Cholesterol: 195 mg/dL (ref 0–200)
HDL: 40.7 mg/dL (ref >39.00)
LDL Cholesterol: 126 mg/dL — ABNORMAL HIGH (ref 0–99)
NonHDL: 154.43
Total CHOL/HDL Ratio: 5
Triglycerides: 141 mg/dL (ref 0.0–149.0)
VLDL: 28.2 mg/dL (ref 0.0–40.0)

## 2022-02-10 MED ORDER — FLUTICASONE FUROATE-VILANTEROL 200-25 MCG/ACT IN AEPB: 1.0000 | INHALATION_SPRAY | Freq: Every day | RESPIRATORY_TRACT | 0 refills | Status: AC

## 2022-02-10 MED ORDER — LOSARTAN POTASSIUM 100 MG PO TABS: 100.0000 mg | ORAL_TABLET | Freq: Every day | ORAL | 0 refills | Status: DC

## 2022-02-10 MED ORDER — ROSUVASTATIN CALCIUM 10 MG PO TABS
10.0000 mg | ORAL_TABLET | Freq: Every day | ORAL | 3 refills | Status: AC
Start: 2022-02-10 — End: ?

## 2022-02-10 MED ORDER — AMLODIPINE BESYLATE 2.5 MG PO TABS: 2.5000 mg | ORAL_TABLET | Freq: Every day | ORAL | 0 refills | Status: DC

## 2022-02-10 NOTE — Patient Instructions (Addendum)
Your HgA1C was improved.  We are checking kidneys, cholesterol. We are referring to urology for low testosterone.

## 2022-02-10 NOTE — Assessment & Plan Note (Signed)
Mild wheezing, possibly asthma trigger Normal exam today and has not been using albuterol inhaler Controlled on Symbicort and encourage patient to use albuterol as needed Patient follow-up if worsening

## 2022-02-10 NOTE — Assessment & Plan Note (Signed)
We will refer to urology for further work-up

## 2022-02-10 NOTE — Addendum Note (Signed)
Addended by: Josephine Igo B on: 02/10/2022 02:25 PM   Modules accepted: Orders

## 2022-02-10 NOTE — Assessment & Plan Note (Signed)
Stable Patient concerned that polyuria is due to diuresis from HCTZ We will DC losartan/HCTZ Prescribed losartan 100 mg Add amlodipine 2.5 mg We will also check urine for possible infection Follow-up  3  month

## 2022-02-10 NOTE — Assessment & Plan Note (Signed)
Encourage patient to consider statin if cholesterol is elevated due to increased risk of heart attack especially in setting of diabetes Patient would like to see the current lipid levels and discussed after results

## 2022-02-10 NOTE — Addendum Note (Signed)
Addended by: Haydee Salter on: 02/10/2022 07:16 PM   Modules accepted: Orders

## 2022-02-10 NOTE — Progress Notes (Signed)
Assessment/Plan:   Problem List Items Addressed This Visit       Cardiovascular and Mediastinum   Hypertension associated with diabetes ([place])    Stable Patient concerned that polyuria is due to diuresis from HCTZ We will DC losartan/HCTZ Prescribed losartan 100 mg Add amlodipine 2.5 mg We will also check urine for possible infection Follow-up  3  month       Relevant Medications   losartan (COZAAR) 100 MG tablet   amLODipine (NORVASC) 2.5 MG tablet   Other Relevant Orders   Microalbumin / creatinine urine ratio   Urinalysis, Routine w reflex microscopic   Comp Met (CMET)   Lipid Profile     Respiratory   Asthma    Mild wheezing, possibly asthma trigger Normal exam today and has not been using albuterol inhaler Controlled on Symbicort and encourage patient to use albuterol as needed Patient follow-up if worsening        Endocrine   Hyperlipidemia associated with type 2 diabetes mellitus ([place])    Encourage patient to consider statin if cholesterol is elevated due to increased risk of heart attack especially in setting of diabetes Patient would like to see the current lipid levels and discussed after results       Relevant Medications   losartan (COZAAR) 100 MG tablet   amLODipine (NORVASC) 2.5 MG tablet   Other Relevant Orders   Microalbumin / creatinine urine ratio   Urinalysis, Routine w reflex microscopic   Comp Met (CMET)   Lipid Profile   Type 2 diabetes mellitus with hyperglycemia, without long-term current use of insulin (HCC) - Primary    Hemoglobin A1c improved Insurance did not cover Rybelsus Patient does not want to try Ozempic due to concern about needles He is not tolerating metformin 1000 mg twice daily       Relevant Medications   losartan (COZAAR) 100 MG tablet   Other Relevant Orders   POCT HgB A1C (Completed)   Microalbumin / creatinine urine ratio   Urinalysis, Routine w reflex microscopic   Comp Met (CMET)   Lipid Profile      Other   Low testosterone    We will refer to urology for further work-up      Other Visit Diagnoses     Hypogonadism in male       Relevant Orders   Ambulatory referral to Urology          Subjective:  HPI:  [name]. is a 56 y.o. male who has Allergic rhinitis; Asthma; Low testosterone; BMI 35.0-35.9,adult; GERD (gastroesophageal reflux disease); Hypertension associated with diabetes ([place]); Erectile dysfunction; Hyperlipidemia associated with type 2 diabetes mellitus ([place]); Type 2 diabetes mellitus with hyperglycemia, without long-term current use of insulin ([place]); Shortness of breath; and Sleep apnea on their problem list..   He  has a past medical history of Anxiety and depression (05/17/2017), Asthma, Atypical nevus of back (06/27/2014), Current mild episode of major depressive disorder without prior episode ([place]) (01/21/2020), Decreased libido (10/31/2012), Gout, GOUT (05/24/2007), H/O vasectomy, Hypertension, Insomnia (02/08/2015), NEOPLASM, SKIN, UNCERTAIN BEHAVIOR (03/24/4578), and Polyarthritis (12/28/2016).Marland Kitchen   He presents with chief complaint of Follow-up (3 month f/u b/p dm. Fasting) .  Hyperlipidemia, established problem,  Current medication(s): Patient not interested in starting medication yet, wants to await results.  Compliant without side effects. Lab Results  Component Value Date   CHOL 181 10/31/2021   HDL 36.50 (L) 10/31/2021   LDLCALC 123 (H) 10/31/2021   LDLDIRECT 130.0 07/21/2021  TRIG 109.0 10/31/2021   CHOLHDL 5 10/31/2021    ROS: No chest pain or shortness of breath. No myalgias.   DIABETES Type II, established problem,  Medications: Metformin, Reports taking and tolerating without side effects.  Interim History-reports that he is trying to eat healthier, but having difficulty doing due to ongoing stress regarding marital relationship  ROS: Endorses polyuria, denies polydipsia, Denies dysuria, hematuria, hypoglycemia symptoms (palpitations,  tremors, anxiousness)   HGBA1C Lab Results  Component Value Date   HGBA1C 7.0 (A) 02/10/2022   HGBA1C 7.6 (H) 10/31/2021   HGBA1C 8.4 (H) 07/21/2021    Lipid Panel Lab Results  Component Value Date   CHOL 181 10/31/2021   TRIG 109.0 10/31/2021   HDL 36.50 (L) 10/31/2021   LDLCALC 123 (H) 10/31/2021    Renal Function Lab Results  Component Value Date   CREATININE 0.98 10/31/2021   GFR 86.13 10/31/2021   Hypertension, established problem, Stable BP Readings from Last 3 Encounters:  02/10/22 138/88  11/04/21 138/88  07/31/21 (!) 169/106    Current Medications: Losartan/HCTZ, patient states that urinates a lot and is concerned that his "fluid pill" Interim History:   ROS: Denies any chest pain, leg edema.   Hypergonadism.  Patient with history of hypogonadism.  Testosterone drawn approximately 3 months ago showed levels of 150.  Patient does not have some ongoing fatigue.  He is interested in testosterone supplementation.  He has seen urology in the past for this.  Asthma.  Patient reports taking has had mild wheezing this morning.  [place].  He has been using his controller inhaler regularly and takes Singulair regularly.  He is not uses albuterol inhaler.  Past Surgical History:  Procedure Laterality Date   ANTERIOR CRUCIATE LIGAMENT REPAIR     TONSILECTOMY, ADENOIDECTOMY, BILATERAL MYRINGOTOMY AND TUBES     VASECTOMY      Outpatient Medications Prior to Visit  Medication Sig Dispense Refill   albuterol (PROAIR HFA) 108 (90 Base) MCG/ACT inhaler INHALE TWO PUFFS INTO THE LUNGS EVERY 6 HOURS AS NEEDED FOR WHEEZING 8.5 g 2   blood glucose meter kit and supplies KIT Dispense based on patient and insurance preference. Use up to four times daily as directed. 1 each 0   budesonide-formoterol (SYMBICORT) 80-4.5 MCG/ACT inhaler INHALE TWO PUFFS INTO THE LUNGS TWICE A DAY 1 Inhaler 2   loratadine (CLARITIN) 10 MG tablet Take 10 mg by mouth daily.      metFORMIN (GLUCOPHAGE) 1000 MG tablet Take 1 tablet (1,000 mg total) by mouth 2 (two) times daily with a meal. 180 tablet 3   montelukast (SINGULAIR) 10 MG tablet Take 1 tablet by mouth daily.     losartan-hydrochlorothiazide (HYZAAR) 100-25 MG tablet TAKE ONE TABLET BY MOUTH DAILY 90 tablet 0   lamoTRIgine (LAMICTAL) 25 MG tablet Take 4 tablets (100 mg total) by mouth 2 (two) times daily. 240 tablet 2   sildenafil (VIAGRA) 100 MG tablet TAKE 1/2 TO 1 TABLET BY MOUTH AS NEEDED FOR ERECTILE DYSFUNCTION (Patient not taking: Reported on 02/10/2022) 10 tablet 2   No facility-administered medications prior to visit.    Family History  Problem Relation Age of Onset   Hypertension Mother    Diabetes Mother    Hypertension Father    Prostate cancer Paternal Uncle     Social History   Socioeconomic History   Marital status: Married    Spouse name: Not on file   Number of children:  Not on file   Years of education: Not on file   Highest education level: Not on file  Occupational History   Occupation: printing   Tobacco Use   Smoking status: Never   Smokeless tobacco: Never  Vaping Use   Vaping Use: Never used  Substance and Sexual Activity   Alcohol use: Yes    Alcohol/week: 2.0 - 3.0 standard drinks of alcohol    Types: 2 - 3 Cans of beer per week    Comment: occ.   Drug use: No   Sexual activity: Yes  Other Topics Concern   Not on file  Social History Narrative   Not on file   Social Determinants of Health   Financial Resource Strain: Not on file  Food Insecurity: Not on file  Transportation Needs: Not on file  Physical Activity: Not on file  Stress: Not on file  Social Connections: Not on file  Intimate Partner Violence: Not on file                                                                                                 Objective:  Physical Exam: BP 138/88 (BP Location: Left Arm, Patient Position: Sitting, Cuff Size: Large)   Pulse 78   Temp (!) 97.4 F  (36.3 C) (Temporal)   Wt 284 lb 12.8 oz (129.2 kg)   SpO2 97%   BMI 40.86 kg/m    General: No acute distress. Awake and conversant.  Eyes: Normal conjunctiva, anicteric. Round symmetric pupils.  ENT: Hearing grossly intact. No nasal discharge.  Neck: Neck is supple. No masses or thyromegaly.  Respiratory: Respirations are non-labored.  Clear to auscultation bilaterally.  Skin: Warm. No rashes or ulcers.  Psych: Alert and oriented. Cooperative, Appropriate mood and affect, Normal judgment.  CV: No cyanosis or JVD, RRR, no MRG MSK: Normal ambulation. No clubbing  Neuro: Sensation and CN II-XII grossly normal.        Alesia Banda, MD, MS

## 2022-02-10 NOTE — Assessment & Plan Note (Signed)
Hemoglobin A1c improved Insurance did not cover Rybelsus Patient does not want to try Ozempic due to concern about needles He is not tolerating metformin 1000 mg twice daily

## 2022-03-18 ENCOUNTER — Ambulatory Visit (INDEPENDENT_AMBULATORY_CARE_PROVIDER_SITE_OTHER): Payer: Commercial Managed Care - HMO | Admitting: Urology

## 2022-03-18 ENCOUNTER — Ambulatory Visit (INDEPENDENT_AMBULATORY_CARE_PROVIDER_SITE_OTHER): Payer: Commercial Managed Care - HMO | Admitting: Nurse Practitioner

## 2022-03-18 ENCOUNTER — Telehealth: Payer: Self-pay | Admitting: Family Medicine

## 2022-03-18 ENCOUNTER — Encounter: Payer: Self-pay | Admitting: Nurse Practitioner

## 2022-03-18 ENCOUNTER — Encounter: Payer: Self-pay | Admitting: Urology

## 2022-03-18 VITALS — BP 197/98 | HR 101 | Ht 71.0 in | Wt 280.0 lb

## 2022-03-18 VITALS — BP 162/98 | HR 86 | Temp 97.0°F | Ht 71.0 in | Wt 287.8 lb

## 2022-03-18 DIAGNOSIS — E1165 Type 2 diabetes mellitus with hyperglycemia: Secondary | ICD-10-CM | POA: Diagnosis not present

## 2022-03-18 DIAGNOSIS — N529 Male erectile dysfunction, unspecified: Secondary | ICD-10-CM | POA: Diagnosis not present

## 2022-03-18 DIAGNOSIS — R7989 Other specified abnormal findings of blood chemistry: Secondary | ICD-10-CM

## 2022-03-18 DIAGNOSIS — I152 Hypertension secondary to endocrine disorders: Secondary | ICD-10-CM

## 2022-03-18 DIAGNOSIS — E1159 Type 2 diabetes mellitus with other circulatory complications: Secondary | ICD-10-CM

## 2022-03-18 DIAGNOSIS — E291 Testicular hypofunction: Secondary | ICD-10-CM | POA: Diagnosis not present

## 2022-03-18 DIAGNOSIS — Z23 Encounter for immunization: Secondary | ICD-10-CM | POA: Diagnosis not present

## 2022-03-18 LAB — POCT GLUCOSE (DEVICE FOR HOME USE): Glucose Fasting, POC: 197 mg/dL — AB (ref 70–99)

## 2022-03-18 MED ORDER — LOSARTAN POTASSIUM-HCTZ 100-25 MG PO TABS: 1.0000 | ORAL_TABLET | Freq: Every day | ORAL | 1 refills | Status: DC

## 2022-03-18 NOTE — Progress Notes (Signed)
Acute Office Visit  Subjective:     Patient ID: [name]., male    DOB: 02-Apr-1965, 56 y.o.   MRN: [id number]  Chief Complaint  Patient presents with   Office Visit    Elevated BP  Headaches x 4 nights Flu shot given today     HPI Patient is in today for elevated blood pressure reading at his urology office.  He states it was elevated at 198/100.  He has been having headaches for the past 4 nights, however thought it might be related to his sinuses.  He has not been checking his blood pressure at home.  He was recently switched from losartan-HCTZ to just losartan 100 mg daily and amlodipine 2.5 mg daily.  He states that he was having some muscle cramps with the HCTZ.  He also has been working hard to control his diabetes.  He is interested in having his glucose levels checked today.  ROS See pertinent positives and negatives per HPI.     Objective:    BP (!) 162/98 (BP Location: Right Arm, Cuff Size: Large)   Pulse 86   Temp (!) 97 F (36.1 C) (Temporal)   Ht '5\' 11"'$  (1.803 m)   Wt 287 lb 12.8 oz (130.5 kg)   SpO2 98%   BMI 40.14 kg/m    Physical Exam Vitals and nursing note reviewed.  Constitutional:      Appearance: Normal appearance.  HENT:     Head: Normocephalic.  Eyes:     Conjunctiva/sclera: Conjunctivae normal.  Cardiovascular:     Rate and Rhythm: Normal rate and regular rhythm.     Pulses: Normal pulses.     Heart sounds: Normal heart sounds.  Pulmonary:     Effort: Pulmonary effort is normal.     Breath sounds: Normal breath sounds.  Musculoskeletal:     Cervical back: Normal range of motion.  Skin:    General: Skin is warm.  Neurological:     General: No focal deficit present.     Mental Status: He is alert and oriented to person, place, and time.  Psychiatric:        Mood and Affect: Mood normal.        Behavior: Behavior normal.        Thought Content: Thought content normal.        Judgment: Judgment normal.     Results for  orders placed or performed in visit on 03/18/22  POCT Glucose (Device for Home Use)  Result Value Ref Range   Glucose Fasting, POC 197 (A) 70 - 99 mg/dL        Assessment & Plan:   Problem List Items Addressed This Visit       Cardiovascular and Mediastinum   Hypertension associated with diabetes ([place]) - Primary    Chronic, not controlled.  BP today 162/98.  He was recently switched off his hydrochlorothiazide due to muscle cramps, however he is willing to restart this again since it did control his blood pressure.  Will have him restart losartan-HCTZ 100-25 mg daily.  He already took his losartan today, instructed him to take the combination pill tomorrow.  He has been taking amlodipine 2.5 mg daily, will have him continue this as well.  Will also have him take an extra dose of the amlodipine 2.5 mg this evening.  Encouraged him to start checking his blood pressure at home.  Follow-up with PCP in neck scheduled appointment.      Relevant  Medications   losartan-hydrochlorothiazide (HYZAAR) 100-25 MG tablet     Endocrine   Type 2 diabetes mellitus with hyperglycemia, without long-term current use of insulin (HCC)    Glucose was 197 today which is at goal because he was not fasting and ate lunch about an hour ago.  Will have him continue the metformin 1000 mg twice a day.      Relevant Medications   losartan-hydrochlorothiazide (HYZAAR) 100-25 MG tablet   Other Relevant Orders   POCT Glucose (Device for Home Use) (Completed)   Other Visit Diagnoses     Need for influenza vaccination       Flu vaccine given today   Relevant Orders   Flu Vaccine QUAD 28moIM (Fluarix, Fluzone & Alfiuria Quad PF) (Completed)   Need for shingles vaccine       Shingrix No. 1 given today   Relevant Orders   Varicella-zoster vaccine IM (Completed)       Meds ordered this encounter  Medications   losartan-hydrochlorothiazide (HYZAAR) 100-25 MG tablet    Sig: Take 1 tablet by mouth daily.     Dispense:  90 tablet    Refill:  1    Will call when he needs this to be filled.    Return if symptoms worsen or fail to improve, for keep next appointment with Dr. TGrandville Silos  LCharyl Dancer NP

## 2022-03-18 NOTE — Assessment & Plan Note (Signed)
Chronic, not controlled.  BP today 162/98.  He was recently switched off his hydrochlorothiazide due to muscle cramps, however he is willing to restart this again since it did control his blood pressure.  Will have him restart losartan-HCTZ 100-25 mg daily.  He already took his losartan today, instructed him to take the combination pill tomorrow.  He has been taking amlodipine 2.5 mg daily, will have him continue this as well.  Will also have him take an extra dose of the amlodipine 2.5 mg this evening.  Encouraged him to start checking his blood pressure at home.  Follow-up with PCP in neck scheduled appointment.

## 2022-03-18 NOTE — Telephone Encounter (Signed)
Caller Name: Amore Grater Call back phone #: [phone]  Reason for Call: Pt went to see urologist he was referred to by Dr. Grandville Silos. They measured his bp at 198/98. He has also been dealing with headaches at night. Asking if he should start back up on combo pill with water pill. Please advise

## 2022-03-18 NOTE — Progress Notes (Signed)
Assessment: 1. Hypogonadism in male   2. Low testosterone   3. Organic impotence     Plan: I reviewed the patient's chart including provider notes and lab results. Only, his recent testosterone levels were drawn after 10:30 in the morning. AM labs:  testosterone, LH, prolactin, CBC Will call with results and recommendations for treatment. Options for management of low testosterone and hypogonadism discussed including topical therapy, oral therapy, short acting injections, long-acting injections, and subcutaneous pellets.  Information provided to the patient.   Chief Complaint:  Chief Complaint  Patient presents with   Hypogonadism    History of Present Illness:  [name]. is a 56 y.o. male who is seen in consultation from Bonnita Hollow, MD for evaluation of low testosterone.  He reports a history of low testosterone for approximately 10 years.  He was previously evaluated by Dr. Risa Grill at [facility] in February 2015.  He was treated with AndroGel which increased his testosterone to 556.  He discontinued the AndroGel as he did not see symptomatic improvement.  He has not been on any testosterone replacement for the past 5 years.  He currently reports symptoms of decreased libido, lack of energy, decrease in strength, loss of height, decreased enjoyment of life, emotional lability, erectile dysfunction, decreased physical performance, fatigue, and decreased work performance. ADAM score = 10/10 today.  Testosterone 8/23: 154.9 Testosterone 5/23: 86.02 PSA 5/23: 0.42  He has mild lower urinary tract symptoms including nocturia x 1, frequency, and urgency.  No dysuria or gross hematuria. IPSS = 5 today.  Past Medical History:  Past Medical History:  Diagnosis Date   Anxiety and depression 05/17/2017   Asthma    Atypical nevus of back 06/27/2014   Current mild episode of major depressive disorder without prior episode ([place]) 01/21/2020   Decreased libido 10/31/2012    Gout    GOUT 05/24/2007   Qualifier: Diagnosis of  By: Linna Darner MD, Gwyndolyn Saxon     H/O vasectomy    Hypertension    Insomnia 02/08/2015   NEOPLASM, SKIN, UNCERTAIN BEHAVIOR 10/25/1658   Qualifier: Diagnosis of  By: Birdie Riddle MD, Belenda Cruise     Polyarthritis 12/28/2016    Past Surgical History:  Past Surgical History:  Procedure Laterality Date   ANTERIOR CRUCIATE LIGAMENT REPAIR     TONSILECTOMY, ADENOIDECTOMY, BILATERAL MYRINGOTOMY AND TUBES     VASECTOMY      Allergies:  Allergies  Allergen Reactions   Levetiracetam Other (See Comments)    Irritability/personality changes    Family History:  Family History  Problem Relation Age of Onset   Hypertension Mother    Diabetes Mother    Hypertension Father    Prostate cancer Paternal Uncle     Social History:  Social History   Tobacco Use   Smoking status: Never   Smokeless tobacco: Never  Vaping Use   Vaping Use: Never used  Substance Use Topics   Alcohol use: Yes    Alcohol/week: 2.0 - 3.0 standard drinks of alcohol    Types: 2 - 3 Cans of beer per week    Comment: occ.   Drug use: No    Review of symptoms:  Constitutional:  Negative for unexplained weight loss, night sweats, fever, chills ENT:  Negative for nose bleeds, sinus pain, painful swallowing CV:  Negative for chest pain, shortness of breath, exercise intolerance, palpitations, loss of consciousness Resp:  Negative for cough, wheezing, shortness of breath GI:  Negative for nausea, vomiting, diarrhea, bloody stools  GU:  Positives noted in HPI; otherwise negative for gross hematuria, dysuria, urinary incontinence Neuro:  Negative for seizures, poor balance, limb weakness, slurred speech Psych:  Negative for lack of energy, depression, anxiety Endocrine:  Negative for polydipsia, polyuria, symptoms of hypoglycemia (dizziness, hunger, sweating) Hematologic:  Negative for anemia, purpura, petechia, prolonged or excessive bleeding, use of anticoagulants  Allergic:   Negative for difficulty breathing or choking as a result of exposure to anything; no shellfish allergy; no allergic response (rash/itch) to materials, foods  Physical exam: BP (!) 197/98   Pulse (!) 101   Ht '5\' 11"'$  (1.803 m)   Wt 280 lb (127 kg)   BMI 39.05 kg/m  GENERAL APPEARANCE:  Well appearing, well developed, well nourished, NAD HEENT: Atraumatic, Normocephalic, oropharynx clear. NECK: Supple without lymphadenopathy or thyromegaly. LUNGS: Clear to auscultation bilaterally. HEART: Regular Rate and Rhythm without murmurs, gallops, or rubs. ABDOMEN: Soft, non-tender, No Masses. EXTREMITIES: Moves all extremities well.  Without clubbing, cyanosis, or edema. NEUROLOGIC:  Alert and oriented x 3, normal gait, CN II-XII grossly intact.  MENTAL STATUS:  Appropriate. BACK:  Non-tender to palpation.  No CVAT SKIN:  Warm, dry and intact.   GU: Penis:  uncircumcised Meatus: Normal Scrotum: normal, no masses Testis: normal without masses bilaterally Prostate: 40 g, NT, no nodules Rectum: Normal tone,  no masses or tenderness   Results: None

## 2022-03-18 NOTE — Assessment & Plan Note (Signed)
Glucose was 197 today which is at goal because he was not fasting and ate lunch about an hour ago.  Will have him continue the metformin 1000 mg twice a day.

## 2022-03-18 NOTE — Patient Instructions (Signed)
It was great to see you!  Stop the plain losartan and restart your losasrtan-hctz combo pill. You can take 1 of your amlodipine tablets tonight. Keep checking your blood pressure at home and writing it down.   Let's follow-up at your next scheduled appointment with Dr. Grandville Silos, sooner with concerns.   Take care,  Vance Peper, NP

## 2022-03-18 NOTE — Telephone Encounter (Signed)
Patient is aware of annotation below and verbalized understanding. Patient scheduled on 03/18/2022 at 2:40pm  with Vance Peper, NP.

## 2022-03-20 ENCOUNTER — Encounter: Payer: Self-pay | Admitting: Urology

## 2022-03-20 ENCOUNTER — Encounter: Payer: Self-pay | Admitting: Nurse Practitioner

## 2022-03-20 LAB — CBC
Hematocrit: 44.6 % (ref 37.5–51.0)
Hemoglobin: 14.7 g/dL (ref 13.0–17.7)
MCH: 26.2 pg — ABNORMAL LOW (ref 26.6–33.0)
MCHC: 33 g/dL (ref 31.5–35.7)
MCV: 80 fL (ref 79–97)
Platelets: 225 10*3/uL (ref 150–450)
RBC: 5.61 x10E6/uL (ref 4.14–5.80)
RDW: 13.6 % (ref 11.6–15.4)
WBC: 8.7 10*3/uL (ref 3.4–10.8)

## 2022-03-20 LAB — LUTEINIZING HORMONE: LH: 7.3 m[IU]/mL (ref 1.7–8.6)

## 2022-03-20 LAB — PROLACTIN: Prolactin: 9.9 ng/mL (ref 3.6–25.2)

## 2022-03-20 LAB — TESTOSTERONE: Testosterone: 230 ng/dL — ABNORMAL LOW (ref 264–916)

## 2022-04-02 ENCOUNTER — Emergency Department (HOSPITAL_BASED_OUTPATIENT_CLINIC_OR_DEPARTMENT_OTHER): Payer: Self-pay | Admitting: Radiology

## 2022-04-02 ENCOUNTER — Telehealth: Payer: Self-pay | Admitting: Family Medicine

## 2022-04-02 ENCOUNTER — Other Ambulatory Visit: Payer: Self-pay

## 2022-04-02 ENCOUNTER — Emergency Department (HOSPITAL_BASED_OUTPATIENT_CLINIC_OR_DEPARTMENT_OTHER)
Admission: EM | Admit: 2022-04-02 | Discharge: 2022-04-02 | Disposition: A | Discharge status: Home / Self Care | Payer: Self-pay | Attending: Emergency Medicine | Admitting: Emergency Medicine

## 2022-04-02 ENCOUNTER — Encounter (HOSPITAL_BASED_OUTPATIENT_CLINIC_OR_DEPARTMENT_OTHER): Payer: Self-pay

## 2022-04-02 ENCOUNTER — Emergency Department (HOSPITAL_BASED_OUTPATIENT_CLINIC_OR_DEPARTMENT_OTHER): Payer: Self-pay

## 2022-04-02 DIAGNOSIS — G44209 Tension-type headache, unspecified, not intractable: Secondary | ICD-10-CM

## 2022-04-02 DIAGNOSIS — I10 Essential (primary) hypertension: Secondary | ICD-10-CM | POA: Insufficient documentation

## 2022-04-02 DIAGNOSIS — J45909 Unspecified asthma, uncomplicated: Secondary | ICD-10-CM | POA: Insufficient documentation

## 2022-04-02 DIAGNOSIS — Z7951 Long term (current) use of inhaled steroids: Secondary | ICD-10-CM | POA: Insufficient documentation

## 2022-04-02 DIAGNOSIS — Z79899 Other long term (current) drug therapy: Secondary | ICD-10-CM | POA: Insufficient documentation

## 2022-04-02 DIAGNOSIS — Z7984 Long term (current) use of oral hypoglycemic drugs: Secondary | ICD-10-CM | POA: Insufficient documentation

## 2022-04-02 LAB — BASIC METABOLIC PANEL
Anion gap: 12 (ref 5–15)
BUN: 22 mg/dL — ABNORMAL HIGH (ref 6–20)
CO2: 27 mmol/L (ref 22–32)
Calcium: 9.8 mg/dL (ref 8.9–10.3)
Chloride: 100 mmol/L (ref 98–111)
Creatinine, Ser: 1 mg/dL (ref 0.61–1.24)
GFR, Estimated: >60 mL/min (ref >60)
Glucose, Bld: 120 mg/dL — ABNORMAL HIGH (ref 70–99)
Potassium: 4.1 mmol/L (ref 3.5–5.1)
Sodium: 139 mmol/L (ref 135–145)

## 2022-04-02 LAB — CBC WITH DIFFERENTIAL/PLATELET
Abs Immature Granulocytes: 0.02 10*3/uL (ref 0.00–0.07)
Basophils Absolute: 0 10*3/uL (ref 0.0–0.1)
Basophils Relative: 0 %
Eosinophils Absolute: 0.3 10*3/uL (ref 0.0–0.5)
Eosinophils Relative: 3 %
HCT: 44.1 % (ref 39.0–52.0)
Hemoglobin: 14.1 g/dL (ref 13.0–17.0)
Immature Granulocytes: 0 %
Lymphocytes Relative: 28 %
Lymphs Abs: 2.5 10*3/uL (ref 0.7–4.0)
MCH: 25.7 pg — ABNORMAL LOW (ref 26.0–34.0)
MCHC: 32 g/dL (ref 30.0–36.0)
MCV: 80.5 fL (ref 80.0–100.0)
Monocytes Absolute: 0.7 10*3/uL (ref 0.1–1.0)
Monocytes Relative: 8 %
Neutro Abs: 5.4 10*3/uL (ref 1.7–7.7)
Neutrophils Relative %: 61 %
Platelets: 230 10*3/uL (ref 150–400)
RBC: 5.48 MIL/uL (ref 4.22–5.81)
RDW: 13.9 % (ref 11.5–15.5)
WBC: 8.9 10*3/uL (ref 4.0–10.5)
nRBC: 0 % (ref 0.0–0.2)

## 2022-04-02 LAB — TROPONIN I (HIGH SENSITIVITY)
Troponin I (High Sensitivity): 4 ng/L (ref <18)
Troponin I (High Sensitivity): 4 ng/L (ref <18)

## 2022-04-02 LAB — URINALYSIS, ROUTINE W REFLEX MICROSCOPIC
Bilirubin Urine: NEGATIVE
Glucose, UA: 100 mg/dL — AB
Hgb urine dipstick: NEGATIVE
Ketones, ur: NEGATIVE mg/dL
Leukocytes,Ua: NEGATIVE
Nitrite: NEGATIVE
Protein, ur: NEGATIVE mg/dL
Specific Gravity, Urine: 1.022 (ref 1.005–1.030)
pH: 5.5 (ref 5.0–8.0)

## 2022-04-02 LAB — MAGNESIUM: Magnesium: 2 mg/dL (ref 1.7–2.4)

## 2022-04-02 MED ORDER — HYDRALAZINE HCL 10 MG PO TABS
10.0000 mg | ORAL_TABLET | Freq: Once | ORAL | Status: AC
  Administered 2022-04-02: 10 mg via ORAL
  Filled 2022-04-02: qty 1

## 2022-04-02 MED ORDER — ACETAMINOPHEN 500 MG PO TABS
1000.0000 mg | ORAL_TABLET | Freq: Once | ORAL | Status: AC
  Administered 2022-04-02: 1000 mg via ORAL
  Filled 2022-04-02: qty 2

## 2022-04-02 NOTE — Discharge Instructions (Addendum)
Please take your hypertensive medications as prescribed. Please take tylenol/ibuprofen for headache. I recommend close follow-up with PCP for reevaluation.  Please do not hesitate to return to emergency department if worrisome signs symptoms we discussed become apparent.

## 2022-04-02 NOTE — ED Notes (Signed)
Patient ambulatory to restroom asked to provide urine sample if able

## 2022-04-02 NOTE — ED Provider Notes (Signed)
[facility] Provider Note   CSN: [id number] Arrival date & time: 04/02/22  1721     History  Chief Complaint  Patient presents with   Hypertension         [name]. is a 57 y.o. male with a pmh of asthma, gout, hypertension, anxiety presenting today for evaluation of hypertension. Patient reports that he has had intermittent headache since after christmas. He checked his bp at home which ranged in the 010-932T systolic. He reports he has been compliant with his bp medications including losartan-hydrochlorothiazide and amlodipine. He has a PCP that he follows up with for bp management. He denies any blurry vision, weakness or numbness.  No fever, nausea, vomiting, bowel changes, urinary symptoms, chest pain, shortness of breath.   Hypertension    Past Medical History:  Diagnosis Date   Anxiety and depression 05/17/2017   Asthma    Atypical nevus of back 06/27/2014   Current mild episode of major depressive disorder without prior episode ([place]) 01/21/2020   Decreased libido 10/31/2012   Gout    GOUT 05/24/2007   Qualifier: Diagnosis of  By: Linna Darner MD, Gwyndolyn Saxon     H/O vasectomy    Hypertension    Insomnia 02/08/2015   NEOPLASM, SKIN, UNCERTAIN BEHAVIOR 07/25/7320   Qualifier: Diagnosis of  By: Birdie Riddle MD, Belenda Cruise     Polyarthritis 12/28/2016   Past Surgical History:  Procedure Laterality Date   ANTERIOR CRUCIATE LIGAMENT REPAIR     TONSILECTOMY, ADENOIDECTOMY, BILATERAL MYRINGOTOMY AND TUBES     VASECTOMY       Home Medications Prior to Admission medications   Medication Sig Start Date End Date Taking? Authorizing Provider  albuterol (PROAIR HFA) 108 (90 Base) MCG/ACT inhaler INHALE TWO PUFFS INTO THE LUNGS EVERY 6 HOURS AS NEEDED FOR WHEEZING 03/09/19   Midge Minium, MD  amLODipine (NORVASC) 2.5 MG tablet Take 1 tablet (2.5 mg total) by mouth daily. 02/10/22 05/11/22  Bonnita Hollow, MD  blood glucose meter kit and supplies KIT  Dispense based on patient and insurance preference. Use up to four times daily as directed. 07/31/21   Bonnita Hollow, MD  fluticasone furoate-vilanterol (BREO ELLIPTA) 200-25 MCG/ACT AEPB Inhale 1 puff into the lungs daily. 02/10/22   Haydee Salter, MD  lamoTRIgine (LAMICTAL) 25 MG tablet Take 4 tablets (100 mg total) by mouth 2 (two) times daily. 07/21/21 10/19/21  Bonnita Hollow, MD  loratadine (CLARITIN) 10 MG tablet Take 10 mg by mouth daily.    [provider]  losartan-hydrochlorothiazide (HYZAAR) 100-25 MG tablet Take 1 tablet by mouth daily. 03/18/22   McElwee, Scheryl Darter, NP  metFORMIN (GLUCOPHAGE) 1000 MG tablet Take 1 tablet (1,000 mg total) by mouth 2 (two) times daily with a meal. 08/05/21   Bonnita Hollow, MD  montelukast (SINGULAIR) 10 MG tablet Take 1 tablet by mouth daily. 06/08/19   [provider]  rosuvastatin (CRESTOR) 10 MG tablet Take 1 tablet (10 mg total) by mouth daily. 02/10/22   Bonnita Hollow, MD  sildenafil (VIAGRA) 100 MG tablet TAKE 1/2 TO 1 TABLET BY MOUTH AS NEEDED FOR ERECTILE DYSFUNCTION 01/29/20   Midge Minium, MD      Allergies    Levetiracetam    Review of Systems   Review of Systems Negative except as per HPI.  Physical Exam Updated Vital Signs BP (!) 147/78   Pulse 72   Temp 97.6 F (36.4 C)   Resp 16  SpO2 100%  Physical Exam Vitals and nursing note reviewed.  Constitutional:      Appearance: Normal appearance.  HENT:     Head: Normocephalic and atraumatic.     Mouth/Throat:     Mouth: Mucous membranes are moist.  Eyes:     General: No scleral icterus. Cardiovascular:     Rate and Rhythm: Normal rate and regular rhythm.     Pulses: Normal pulses.     Heart sounds: Normal heart sounds.  Pulmonary:     Effort: Pulmonary effort is normal.     Breath sounds: Normal breath sounds.  Abdominal:     General: Abdomen is flat.     Palpations: Abdomen is soft.     Tenderness: There is no abdominal tenderness.   Musculoskeletal:        General: No deformity.  Skin:    General: Skin is warm.     Findings: No rash.  Neurological:     General: No focal deficit present.     Mental Status: He is alert.     Comments: Cranial nerves II through XII intact. Intact sensation to light touch in all 4 extremities. 5/5 strength in all 4 extremities. Intact finger-to-nose and heel-to-shin of all 4 extremities. No visual field cuts. No neglect noted. No aphasia noted.   Psychiatric:        Mood and Affect: Mood normal.     ED Results / Procedures / Treatments   Labs (all labs ordered are listed, but only abnormal results are displayed) Labs Reviewed  CBC WITH DIFFERENTIAL/PLATELET - Abnormal; Notable for the following components:      Result Value   MCH 25.7 (*)    All other components within normal limits  BASIC METABOLIC PANEL - Abnormal; Notable for the following components:   Glucose, Bld 120 (*)    BUN 22 (*)    All other components within normal limits  URINALYSIS, ROUTINE W REFLEX MICROSCOPIC - Abnormal; Notable for the following components:   Glucose, UA 100 (*)    All other components within normal limits  MAGNESIUM  TROPONIN I (HIGH SENSITIVITY)  TROPONIN I (HIGH SENSITIVITY)    EKG EKG Interpretation  Date/Time:  Thursday April 02 2022 18:06:58 EST Ventricular Rate:  75 PR Interval:  172 QRS Duration: 89 QT Interval:  376 QTC Calculation: 420 R Axis:   47 Text Interpretation: Sinus rhythm Probable left atrial enlargement Confirmed by Cindee Lame [phone]) on 04/02/2022 6:09:13 PM  Radiology CT Head Wo Contrast  Result Date: 04/02/2022 CLINICAL DATA:  Headache EXAM: CT HEAD WITHOUT CONTRAST TECHNIQUE: Contiguous axial images were obtained from the base of the skull through the vertex without intravenous contrast. RADIATION DOSE REDUCTION: This exam was performed according to the departmental dose-optimization program which includes automated exposure control, adjustment of the  mA and/or kV according to patient size and/or use of iterative reconstruction technique. COMPARISON:  CT brain 06/20/2018 FINDINGS: Brain: No evidence of acute infarction, hemorrhage, hydrocephalus, extra-axial collection or mass lesion/mass effect. Vascular: No hyperdense vessels.  Carotid vascular calcification. Skull: Normal. Negative for fracture or focal lesion. Sinuses/Orbits: Mild mucosal thickening in the sinuses. Other: None IMPRESSION: Negative non contrasted CT appearance of the brain. Electronically Signed   By: Donavan Foil M.D.   On: 04/02/2022 20:19   DG Chest Port 1 View  Result Date: 04/02/2022 CLINICAL DATA:  Pt c/o HTN x "week or more," associated HA. No meds PTA, reports no issue w daily meds. Reports associated tremor. 170/107  158/105 191/106 States he was seen at ED in [place], treated for low mag EXAM: PORTABLE CHEST - 1 VIEW COMPARISON:  07/30/2021 FINDINGS: Lungs are clear. Heart size and mediastinal contours are within normal limits. No effusion. Visualized bones unremarkable. IMPRESSION: No acute cardiopulmonary disease. Electronically Signed   By: Lucrezia Europe M.D.   On: 04/02/2022 18:25    Procedures Procedures    Medications Ordered in ED Medications  acetaminophen (TYLENOL) tablet 1,000 mg (1,000 mg Oral Given 04/02/22 1846)  hydrALAZINE (APRESOLINE) tablet 10 mg (10 mg Oral Given 04/02/22 1933)    ED Course/ Medical Decision Making/ A&P                           Medical Decision Making Amount and/or Complexity of Data Reviewed Radiology: ordered.  Risk OTC drugs. Prescription drug management.   This patient presents to the ED for hypertension, headache, this involves an extensive number of treatment options, and is a complaint that carries with a high risk of complications and morbidity.  The differential diagnosis includes uncontrolled hypertension, ICH, AKI electrolyte abnormalities, ACS.  This is not an exhaustive list.  Lab tests: I ordered and personally  interpreted labs.  The pertinent results include: WBC unremarkable. Hbg unremarkable. Platelets unremarkable. No electrolyte abnormalities noted. BUN, creatinine unremarkable. UA significant for no acute abnormality.  Imaging studies: I ordered imaging studies. I personally reviewed, interpreted imaging and agree with the radiologist's interpretations. The results include: CT head negative, chest x-ray negative.  Problem list/ ED course/ Critical interventions/ Medical management: HPI: See above Vital signs within normal range and stable throughout visit. Laboratory/imaging studies significant for: See above. On physical examination, patient is afebrile and appears in no acute distress. This patient presents with a headache most consistent with benign headache from either tension type headache vs migraine. No headache red flags. Neurologic exam without evidence of meningismus, AMS, focal neurologic findings so doubt meningitis, encephalitis, stroke. Presentation not consistent with acute intracranial bleed to include SAH. No history of trauma so doubt ICH. Patient with no signs of increased intracranial pressure or weight loss and history and physical suggest more benign headache so less likely mass effect in brain from tumor or abscess or idiopathic intracranial hypertension. Pain was controlled with tylenol. Hydralazine ordered with high bp. Reevaluation of the patient after these medications showed that the patient improved. Advised patient to take his hypertensive medication as prescribed.  Follow-up with primary care physician for further evaluation and management of hypertension.  Return to the ER if new or worsening symptoms. I have reviewed the patient home medicines and have made adjustments as needed.  Cardiac monitoring/EKG: The patient was maintained on a cardiac monitor.  I personally reviewed and interpreted the cardiac monitor which showed an underlying rhythm of: sinus  rhythm.  Additional history obtained: External records from outside source obtained and reviewed including: Chart review including previous notes, labs, imaging.  Consultations obtained:  Disposition Continued outpatient therapy. Follow-up with PCP recommended for reevaluation of symptoms. Treatment plan discussed with patient.  Pt acknowledged understanding was agreeable to the plan. Worrisome signs and symptoms were discussed with patient, and patient acknowledged understanding to return to the ED if they noticed these signs and symptoms. Patient was stable upon discharge.   This chart was dictated using voice recognition software.  Despite best efforts to proofread,  errors can occur which can change the documentation meaning.  Final Clinical Impression(s) / ED Diagnoses Final diagnoses:  Primary hypertension  Acute non intractable tension-type headache    Rx / DC Orders ED Discharge Orders     None         Rex Kras, [place] 04/03/22 1108    Audley Hose, MD 04/03/22 [id number]

## 2022-04-02 NOTE — ED Triage Notes (Signed)
Pt c/o HTN x "week or more," associated HA. No meds PTA, reports no issue w daily meds. Reports associated tremor.   170/107 158/105 191/106  States he was seen at ED in [place], treated for low mag

## 2022-04-02 NOTE — Telephone Encounter (Signed)
Pt showed up at 4:40 wanting his bp read, I let him know we had no available app's. I let him know he should go to urgent care. He would like a call back concerning this issue. [phone].  He did call earlier and was sent to nurse triage, I did not speak with him, so I don't know the numbers.

## 2022-04-02 NOTE — ED Notes (Signed)
Patient transported to CT 

## 2022-04-03 ENCOUNTER — Ambulatory Visit (INDEPENDENT_AMBULATORY_CARE_PROVIDER_SITE_OTHER): Payer: Self-pay | Admitting: Family Medicine

## 2022-04-03 ENCOUNTER — Encounter: Payer: Self-pay | Admitting: Family Medicine

## 2022-04-03 ENCOUNTER — Telehealth: Payer: Self-pay | Admitting: Family Medicine

## 2022-04-03 VITALS — BP 162/96 | HR 85 | Temp 97.8°F | Ht 71.0 in | Wt 284.0 lb

## 2022-04-03 DIAGNOSIS — I10 Essential (primary) hypertension: Secondary | ICD-10-CM

## 2022-04-03 MED ORDER — AMLODIPINE BESYLATE 5 MG PO TABS: 5.0000 mg | ORAL_TABLET | Freq: Every day | ORAL | 0 refills | Status: DC

## 2022-04-03 NOTE — Telephone Encounter (Signed)
Per Dr Birdie Riddle she is unable to accept the pt back at this time

## 2022-04-03 NOTE — Telephone Encounter (Signed)
Pt is asking if Dr Birdie Riddle can take him back as an pt ?

## 2022-04-03 NOTE — Patient Instructions (Signed)
measure and record blood pressures twice daily.

## 2022-04-03 NOTE — Progress Notes (Signed)
[facility] [address] [place] [address] Dept: [phone] Dept Fax: [phone]  Office Visit  Subjective:    Patient ID: George Needle., male    DOB: 01-08-1966, 57 y.o..   MRN: [id number]  Chief Complaint  Patient presents with   Acute Visit    History of Present Illness:  Patient is in today for evaluation of an acutely elevated blood pressure. [name] has a history of essential hypertension, Type 2 diabetes, and obesity. He was seen by Dr. Grandville Silos in Nov. At that time, he complained of some crampiness. His blood pressure medicine was switched from Hyzaar 100-25 to losartan 100 mg daily and amlodipine 2.5 mg daily. His blood pressure was mildly elevated at the time (138/88). On 12/27., he was seen by urology related to hypogonadism. At the time, his BP was 197/98. He saw Ms. McElwee later that day. She had him resume the Hyzaar and continue the amlodipine. The next day, his BP was still very high and he had some palpitations. He was seen at an ED in [place], [place]. They found him to have a low magnesium level and gave him IV magnesium. His blood pressure has continued to measure quite high on his home BP unit. He was seen at [facility] ED last night. He was given a dose of hydralazine. His lab evaluation was benign. He is in today for follow-up.  [name] does drink caffeine throughout the day. He denies tobacco use. He has been drinking alcohol daily more recently. He notes he is undergoing a divorce. He has had some stressors. He notes at times he has some tremor present.  Past Medical History: Patient Active Problem List   Diagnosis Date Noted   Hypogonadism in male 03/18/2022   Sleep apnea 11/04/2021   Type 2 diabetes mellitus with hyperglycemia, without long-term current use of insulin ([place]) 07/31/2021   Shortness of breath 07/31/2021   Hyperlipidemia associated with type 2 diabetes mellitus ([place])  07/21/2021   Erectile dysfunction 12/18/2015   Hypertension associated with diabetes ([place]) 02/08/2015   GERD (gastroesophageal reflux disease) 02/20/2014   Low testosterone 04/24/2013   BMI 35.0-35.9,adult 04/24/2013   Asthma 03/12/2011   Allergic rhinitis 10/19/2006   Past Surgical History:  Procedure Laterality Date   ANTERIOR CRUCIATE LIGAMENT REPAIR     TONSILECTOMY, ADENOIDECTOMY, BILATERAL MYRINGOTOMY AND TUBES     VASECTOMY     Family History  Problem Relation Age of Onset   Hypertension Mother    Diabetes Mother    Hypertension Father    Prostate cancer Paternal Uncle    Outpatient Medications Prior to Visit  Medication Sig Dispense Refill   albuterol (PROAIR HFA) 108 (90 Base) MCG/ACT inhaler INHALE TWO PUFFS INTO THE LUNGS EVERY 6 HOURS AS NEEDED FOR WHEEZING 8.5 g 2   blood glucose meter kit and supplies KIT Dispense based on patient and insurance preference. Use up to four times daily as directed. 1 each 0   fluticasone furoate-vilanterol (BREO ELLIPTA) 200-25 MCG/ACT AEPB Inhale 1 puff into the lungs daily. 1 each 0   lamoTRIgine (LAMICTAL) 25 MG tablet Take 4 tablets (100 mg total) by mouth 2 (two) times daily. 240 tablet 2   loratadine (CLARITIN) 10 MG tablet Take 10 mg by mouth daily.     losartan-hydrochlorothiazide (HYZAAR) 100-25 MG tablet Take 1 tablet by mouth daily. 90 tablet 1   metFORMIN (GLUCOPHAGE) 1000 MG tablet Take 1 tablet (1,000 mg total) by mouth  2 (two) times daily with a meal. 180 tablet 3   montelukast (SINGULAIR) 10 MG tablet Take 1 tablet by mouth daily.     rosuvastatin (CRESTOR) 10 MG tablet Take 1 tablet (10 mg total) by mouth daily. 90 tablet 3   sildenafil (VIAGRA) 100 MG tablet TAKE 1/2 TO 1 TABLET BY MOUTH AS NEEDED FOR ERECTILE DYSFUNCTION 10 tablet 2   amLODipine (NORVASC) 2.5 MG tablet Take 1 tablet (2.5 mg total) by mouth daily. 90 tablet 0   No facility-administered medications prior to visit.   Allergies  Allergen Reactions    Levetiracetam Other (See Comments)    Irritability/personality changes     Objective:   Today's Vitals   04/03/22 1111  BP: (!) 162/96  Pulse: 85  Temp: 97.8 F (36.6 C)  TempSrc: Temporal  SpO2: 98%  Weight: 284 lb (128.8 kg)  Height: '5\' 11"'$  (1.803 m)   Body mass index is 39.61 kg/m.   General: Well developed, well nourished. No acute distress. Psych: Alert and oriented. Normal mood and affect.  Health Maintenance Due  Topic Date Due   FOOT EXAM  Never done   OPHTHALMOLOGY EXAM  Never done   HIV Screening  Never done   Hepatitis C Screening  Never done   Fecal DNA (Cologuard)  07/26/2018   Lab Results    Latest Ref Rng & Units 04/02/2022    6:15 PM 02/10/2022   10:56 AM 10/31/2021   11:34 AM  CMP  Glucose 70 - 99 mg/dL 120  99  136   BUN 6 - 20 mg/dL '22  15  14   '$ Creatinine 0.61 - 1.24 mg/dL 1.00  0.90  0.98   Sodium 135 - 145 mmol/L 139  140  140   Potassium 3.5 - 5.1 mmol/L 4.1  3.8  4.2   Chloride 98 - 111 mmol/L 100  101  103   CO2 22 - 32 mmol/L 27  32  28   Calcium 8.9 - 10.3 mg/dL 9.8  9.2  9.1   Total Protein 6.0 - 8.3 g/dL  7.3  6.7   Total Bilirubin 0.2 - 1.2 mg/dL  1.4  1.0   Alkaline Phos 39 - 117 U/L  65  61   AST 0 - 37 U/L  19  16   ALT 0 - 53 U/L  26  25    Component Ref Range & Units 1 d ago 3 yr ago  Magnesium 1.7 - 2.4 mg/dL 2.0 1.9 CM    Assessment & Plan:   1. Hypertension, unspecified type [name] has been experiencing an acute elevation in his blood pressures over the last several weeks. This is likely multifactorial, related to acute marital stressors, caffeine and alcohol use. I recommend he continue Hyzaar 100-25 mg daily. I will increase his amlodipine to 5 mg daily. I recommend he continue to monitor his blood pressure twice daily at home. I will have him follow-up next week. If his blood pressure is not at goal, we might consider adding a beta blocker. It seems like his stress is a primary driver. A beta blocker may be more  effective for blunting the impact of stress hormones and in reducing his tremor.  - amLODipine (NORVASC) 5 MG tablet; Take 1 tablet (5 mg total) by mouth daily.  Dispense: 90 tablet; Refill: 0   Return in about 1 week (around 04/10/2022) for Reassessment with PCP or Dr. Gena Fray.   Haydee Salter, MD

## 2022-04-03 NOTE — Telephone Encounter (Signed)
Caller name: [name].  On DPR?: Yes  Call back number: [phone] (mobile)  Provider they see: Bonnita Hollow, MD  Reason for call:  Patient called stating that he [place] Dr[place] Patient wants to know if Dr.tabor would take him back as a patient.

## 2022-04-03 NOTE — Telephone Encounter (Signed)
Patient seen in office today by Dr. Gena Fray

## 2022-04-03 NOTE — Telephone Encounter (Signed)
Unable to accept at this time

## 2022-04-03 NOTE — Telephone Encounter (Signed)
Called patient back and informed him he was understanding declined visit with Dimas Millin and will "check back periodically"

## 2022-04-06 ENCOUNTER — Encounter: Payer: Self-pay | Admitting: Urology

## 2022-04-08 ENCOUNTER — Encounter: Payer: Self-pay | Admitting: Nurse Practitioner

## 2022-04-15 ENCOUNTER — Telehealth: Payer: Self-pay | Admitting: Family Medicine

## 2022-04-15 DIAGNOSIS — I152 Hypertension secondary to endocrine disorders: Secondary | ICD-10-CM

## 2022-04-16 NOTE — Telephone Encounter (Signed)
Pt called stating pharmacy does not have RX of amlodipine. It does not appear it went through e-scribe (RX says "no print")  Please resend

## 2022-04-17 ENCOUNTER — Telehealth: Payer: Self-pay | Admitting: Family Medicine

## 2022-04-17 ENCOUNTER — Other Ambulatory Visit: Payer: Self-pay

## 2022-04-17 DIAGNOSIS — I10 Essential (primary) hypertension: Secondary | ICD-10-CM

## 2022-04-17 MED ORDER — AMLODIPINE BESYLATE 5 MG PO TABS: 5.0000 mg | ORAL_TABLET | Freq: Every day | ORAL | 0 refills | Status: DC

## 2022-04-17 NOTE — Telephone Encounter (Signed)
Rx sent to [facility] as requested.

## 2022-04-17 NOTE — Telephone Encounter (Signed)
Left patient a detailed voice message to return call to office to confirm pharmacy

## 2022-04-17 NOTE — Telephone Encounter (Signed)
Left patient a detailed voice message to return call to office to confirm pharmacy.

## 2022-04-17 NOTE — Telephone Encounter (Signed)
Pt uses [facility].

## 2022-04-17 NOTE — Telephone Encounter (Signed)
Chart supports rx. Last OV: 04/03/2022 Next OV: 03/18/2022

## 2022-04-21 ENCOUNTER — Encounter: Payer: Self-pay | Admitting: Urology

## 2022-04-23 ENCOUNTER — Other Ambulatory Visit: Payer: Self-pay | Admitting: Family Medicine

## 2022-04-23 DIAGNOSIS — I152 Hypertension secondary to endocrine disorders: Secondary | ICD-10-CM

## 2022-04-24 ENCOUNTER — Other Ambulatory Visit: Payer: Self-pay | Admitting: Family Medicine

## 2022-04-24 DIAGNOSIS — I10 Essential (primary) hypertension: Secondary | ICD-10-CM

## 2022-04-24 MED ORDER — AMLODIPINE BESYLATE 5 MG PO TABS: 5.0000 mg | ORAL_TABLET | Freq: Every day | ORAL | 0 refills | Status: AC

## 2022-04-24 MED ORDER — LOSARTAN POTASSIUM-HCTZ 100-25 MG PO TABS: 1.0000 | ORAL_TABLET | Freq: Every day | ORAL | 0 refills | Status: AC

## 2022-04-24 NOTE — Progress Notes (Signed)
Received call from On Call Service Patient reports out of BP medications for 2 weeks. Waiting for refills to be sent to pharmacy. Appears script set to No Print.  Requesting Amlodipine 5 mg and Hyzar 100-25 mg sent to [facility] for above x 30 days Has appointment schedule with PCP 02/21  Carollee Leitz, MD

## 2022-05-05 ENCOUNTER — Encounter: Payer: Self-pay | Admitting: Urology

## 2022-05-13 ENCOUNTER — Ambulatory Visit: Payer: Self-pay | Admitting: Family Medicine

## 2022-05-20 ENCOUNTER — Other Ambulatory Visit: Payer: Self-pay | Admitting: Family Medicine

## 2022-06-19 ENCOUNTER — Other Ambulatory Visit: Payer: Self-pay | Admitting: Family Medicine

## 2022-10-08 ENCOUNTER — Other Ambulatory Visit: Payer: Self-pay | Admitting: Family Medicine

## 2023-10-07 ENCOUNTER — Telehealth: Payer: Self-pay

## 2023-10-07 NOTE — Transitions of Care (Post Inpatient/ED Visit) (Signed)
   10/07/2023  Name: [name]. MRN: [id number] DOB: 1966/02/05  Today's TOC FU Call Status: Today's TOC FU Call Status:: Successful TOC FU Call Completed TOC FU Call Complete Date: 10/07/23 Patient's Name and Date of Birth confirmed.  Transition Care Management Follow-up Telephone Call Date of Discharge: 10/06/23 Discharge Facility: Other ([facility]) Name of Other ([facility]) Discharge Facility: [facility] Type of Discharge: Inpatient Admission Primary Inpatient Discharge Diagnosis:: pancreatitis How have you been since you were released from the hospital?: Better Any questions or concerns?: No  Items Reviewed: Did you receive and understand the discharge instructions provided?: Yes Medications obtained,verified, and reconciled?: Yes (Medications Reviewed) Any new allergies since your discharge?: No Dietary orders reviewed?: Yes Do you have support at home?: Yes People in Home [RPT]: spouse  Medications Reviewed Today: Medications Reviewed Today   Medications were not reviewed in this encounter     Home Care and Equipment/Supplies: Were Home Health Services Ordered?: NA Any new equipment or medical supplies ordered?: NA  Functional Questionnaire: Do you need assistance with bathing/showering or dressing?: No Do you need assistance with meal preparation?: No Do you need assistance with eating?: No Do you have difficulty maintaining continence: No Do you need assistance with getting out of bed/getting out of a chair/moving?: No Do you have difficulty managing or taking your medications?: No  Follow up appointments reviewed: PCP Follow-up appointment confirmed?: No (transferred to [facility]) Specialist Hospital Follow-up appointment confirmed?: Yes Date of Specialist follow-up appointment?: 10/26/23 Follow-Up Specialty Provider:: GI Do you need transportation to your follow-up appointment?: No Do you understand care options if your condition(s) worsen?: Yes-patient  verbalized understanding    SIGNATURE Julian Lemmings, LPN [facility] Nurse Health Advisor Direct Dial [phone]

## 2023-11-04 ENCOUNTER — Other Ambulatory Visit: Payer: Self-pay | Admitting: Family Medicine

## 2024-03-25 ENCOUNTER — Emergency Department (HOSPITAL_COMMUNITY)

## 2024-03-25 ENCOUNTER — Emergency Department (HOSPITAL_COMMUNITY)
Admission: EM | Admit: 2024-03-25 | Discharge: 2024-03-25 | Disposition: A | Discharge status: Home / Self Care | Attending: Emergency Medicine | Admitting: Emergency Medicine

## 2024-03-25 DIAGNOSIS — E119 Type 2 diabetes mellitus without complications: Secondary | ICD-10-CM | POA: Insufficient documentation

## 2024-03-25 DIAGNOSIS — R55 Syncope and collapse: Secondary | ICD-10-CM | POA: Insufficient documentation

## 2024-03-25 DIAGNOSIS — J45909 Unspecified asthma, uncomplicated: Secondary | ICD-10-CM | POA: Diagnosis not present

## 2024-03-25 DIAGNOSIS — Z7951 Long term (current) use of inhaled steroids: Secondary | ICD-10-CM | POA: Insufficient documentation

## 2024-03-25 DIAGNOSIS — D649 Anemia, unspecified: Secondary | ICD-10-CM | POA: Insufficient documentation

## 2024-03-25 DIAGNOSIS — C251 Malignant neoplasm of body of pancreas: Secondary | ICD-10-CM | POA: Diagnosis not present

## 2024-03-25 DIAGNOSIS — R42 Dizziness and giddiness: Secondary | ICD-10-CM

## 2024-03-25 DIAGNOSIS — Z79899 Other long term (current) drug therapy: Secondary | ICD-10-CM | POA: Diagnosis not present

## 2024-03-25 DIAGNOSIS — S0083XA Contusion of other part of head, initial encounter: Secondary | ICD-10-CM | POA: Insufficient documentation

## 2024-03-25 DIAGNOSIS — D72829 Elevated white blood cell count, unspecified: Secondary | ICD-10-CM | POA: Insufficient documentation

## 2024-03-25 DIAGNOSIS — W1830XA Fall on same level, unspecified, initial encounter: Secondary | ICD-10-CM | POA: Diagnosis not present

## 2024-03-25 DIAGNOSIS — I10 Essential (primary) hypertension: Secondary | ICD-10-CM | POA: Diagnosis not present

## 2024-03-25 LAB — URINALYSIS, ROUTINE W REFLEX MICROSCOPIC
Bacteria, UA: NONE SEEN
Bilirubin Urine: NEGATIVE
Glucose, UA: NEGATIVE mg/dL
Hgb urine dipstick: NEGATIVE
Ketones, ur: NEGATIVE mg/dL
Leukocytes,Ua: NEGATIVE
Nitrite: NEGATIVE
Protein, ur: 30 mg/dL — AB
Specific Gravity, Urine: 1.019 (ref 1.005–1.030)
pH: 6 (ref 5.0–8.0)

## 2024-03-25 LAB — MAGNESIUM: Magnesium: 1.9 mg/dL (ref 1.7–2.4)

## 2024-03-25 LAB — TROPONIN T, HIGH SENSITIVITY
Troponin T High Sensitivity: 23 ng/L — ABNORMAL HIGH (ref 0–19)
Troponin T High Sensitivity: 16 ng/L (ref 0–19)

## 2024-03-25 LAB — COMPREHENSIVE METABOLIC PANEL WITH GFR
ALT: 29 U/L (ref 0–44)
AST: 39 U/L (ref 15–41)
Albumin: 3.4 g/dL — ABNORMAL LOW (ref 3.5–5.0)
Alkaline Phosphatase: 119 U/L (ref 38–126)
Anion gap: 12 (ref 5–15)
BUN: 10 mg/dL (ref 6–20)
CO2: 33 mmol/L — ABNORMAL HIGH (ref 22–32)
Calcium: 9 mg/dL (ref 8.9–10.3)
Chloride: 95 mmol/L — ABNORMAL LOW (ref 98–111)
Creatinine, Ser: 1.07 mg/dL (ref 0.61–1.24)
GFR, Estimated: >60 mL/min
Glucose, Bld: 118 mg/dL — ABNORMAL HIGH (ref 70–99)
Potassium: 3.1 mmol/L — ABNORMAL LOW (ref 3.5–5.1)
Sodium: 140 mmol/L (ref 135–145)
Total Bilirubin: 0.7 mg/dL (ref 0.0–1.2)
Total Protein: 6.4 g/dL — ABNORMAL LOW (ref 6.5–8.1)

## 2024-03-25 LAB — CBC
HCT: 33.1 % — ABNORMAL LOW (ref 39.0–52.0)
Hemoglobin: 10.6 g/dL — ABNORMAL LOW (ref 13.0–17.0)
MCH: 25.4 pg — ABNORMAL LOW (ref 26.0–34.0)
MCHC: 32 g/dL (ref 30.0–36.0)
MCV: 79.2 fL — ABNORMAL LOW (ref 80.0–100.0)
Platelets: 233 K/uL (ref 150–400)
RBC: 4.18 MIL/uL — ABNORMAL LOW (ref 4.22–5.81)
RDW: 15 % (ref 11.5–15.5)
WBC: 17.1 K/uL — ABNORMAL HIGH (ref 4.0–10.5)
nRBC: 0 % (ref 0.0–0.2)

## 2024-03-25 LAB — CBG MONITORING, ED: Glucose-Capillary: 97 mg/dL (ref 70–99)

## 2024-03-25 MED ORDER — LACTATED RINGERS IV BOLUS
1000.0000 mL | Freq: Once | INTRAVENOUS | Status: AC
  Administered 2024-03-25: 1000 mL via INTRAVENOUS

## 2024-03-25 MED ORDER — POTASSIUM CHLORIDE CRYS ER 20 MEQ PO TBCR
20.0000 meq | EXTENDED_RELEASE_TABLET | Freq: Once | ORAL | Status: AC
  Administered 2024-03-25: 20 meq via ORAL
  Filled 2024-03-25: qty 1

## 2024-03-25 MED ORDER — POTASSIUM CHLORIDE CRYS ER 20 MEQ PO TBCR: 40.0000 meq | EXTENDED_RELEASE_TABLET | Freq: Once | ORAL | Status: DC

## 2024-03-25 NOTE — ED Provider Notes (Signed)
 " [place] EMERGENCY DEPARTMENT AT [facility] Provider Note   CSN: [id number] Arrival date & time: 03/25/24  1059     Patient presents with: [name]. is a 59 y.o. male who presents to the emergency department with a chief complaint of dizziness and syncopal episode.  Patient states that he has had 2 episodes of dizziness today, the second of which resulted in him passing out.  His wife tried to catch him however was unable to fully catch him before he hit the floor. Patient states that he fell backwards and did hit the back of his head. Per wife patient came back to almost immediately and has been at baseline since, including walking normally. Patient was recently diagnosed with pancreatic cancer which surgery was performed for back in September.  Patient is currently under course of chemotherapy treatments and recently received stim medication yesterday.  States that he has not had any changes in chemo treatment recently or added on any medications.  Patient states that he has had reduced p.o. intake since his surgery.  Wife is at bedside and states that the patient has only had half a bagel to eat today.  Wife and patient concerned that his dizziness and syncope were due to low blood pressure and possible dehydration.  Patient denies infectious symptoms including fever, chills, nausea, vomiting, diarrhea.   Denies blood thinning medication.  Other past medical history significant for asthma, low testosterone , GERD, hypertension, diabetes, hyperlipidemia, pancreatic cancer, etc. Patient denies known cardiac history.    Fall       Prior to Admission medications  Medication Sig Start Date End Date Taking? Authorizing Provider  albuterol  (PROAIR  HFA) 108 (90 Base) MCG/ACT inhaler INHALE TWO PUFFS INTO THE LUNGS EVERY 6 HOURS AS NEEDED FOR WHEEZING 03/09/19   Tabori, Katherine E, MD  amLODipine  (NORVASC ) 5 MG tablet Take 1 tablet (5 mg total) by mouth daily. 04/24/22  07/23/22  Hope Merle, MD  blood glucose meter kit and supplies KIT Dispense based on patient and insurance preference. Use up to four times daily as directed. 07/31/21   Sebastian Beverley NOVAK, MD  fluticasone  furoate-vilanterol (BREO ELLIPTA ) 200-25 MCG/ACT AEPB Inhale 1 puff into the lungs daily. 02/10/22   Thedora Garnette HERO, MD  lamoTRIgine  (LAMICTAL ) 25 MG tablet TAKE FOUR TABLETS BY MOUTH TWICE A DAY 06/24/22   Webb, Padonda B, FNP  loratadine (CLARITIN) 10 MG tablet Take 10 mg by mouth daily.    [provider]  losartan -hydrochlorothiazide  (HYZAAR) 100-25 MG tablet Take 1 tablet by mouth daily. 04/24/22   Hope Merle, MD  metFORMIN  (GLUCOPHAGE ) 1000 MG tablet TAKE ONE TABLET BY MOUTH TWICE A DAY WITH MEALS 10/09/22   Sebastian Beverley NOVAK, MD  montelukast (SINGULAIR) 10 MG tablet Take 1 tablet by mouth daily. 06/08/19   [provider]  rosuvastatin  (CRESTOR ) 10 MG tablet Take 1 tablet (10 mg total) by mouth daily. 02/10/22   Sebastian Beverley NOVAK, MD  sildenafil  (VIAGRA ) 100 MG tablet TAKE 1/2 TO 1 TABLET BY MOUTH AS NEEDED FOR ERECTILE DYSFUNCTION 01/29/20   Tabori, Katherine E, MD    Allergies: Emend [fosaprepitant dimeglumine] and Levetiracetam    Review of Systems  Constitutional:        Syncope    Updated Vital Signs BP 123/86 (BP Location: Right Arm)   Pulse 64   Temp (!) 97 F (36.1 C) (Oral)   Resp (!) 24   Ht 5' 10 (1.778 m)  Wt 86.2 kg   SpO2 100%   BMI 27.26 kg/m   Physical Exam Vitals and nursing note reviewed.  Constitutional:      General: He is awake. He is not in acute distress.    Appearance: Normal appearance. He is not ill-appearing, toxic-appearing or diaphoretic.  HENT:     Head: Normocephalic.     Comments: Small hematoma present to back of head, no open wounds/abrasions/lacerations, no tenderness of face or scalp other than surrounding hematoma Eyes:     General: No scleral icterus.    Comments: Vision grossly intact  Neck:     Comments: No  cervical spine tenderness Cardiovascular:     Rate and Rhythm: Normal rate and regular rhythm.  Pulmonary:     Effort: Pulmonary effort is normal. No respiratory distress.     Breath sounds: No stridor. No wheezing, rhonchi or rales.  Abdominal:     General: Abdomen is flat. There is no distension.     Palpations: Abdomen is soft.     Tenderness: There is no abdominal tenderness.  Musculoskeletal:        General: Normal range of motion.     Cervical back: Normal range of motion. No tenderness.     Right lower leg: No edema.     Left lower leg: No edema.     Comments: Grossly normal ROM of all 4 extremities, patient ambulatory without assistance, no tenderness of chest wall or extremities, pelvis feels stable  Skin:    General: Skin is warm.     Capillary Refill: Capillary refill takes less than 2 seconds.  Neurological:     General: No focal deficit present.     Mental Status: He is alert and oriented to person, place, and time.     Gait: Gait normal.  Psychiatric:        Mood and Affect: Mood normal.        Behavior: Behavior normal. Behavior is cooperative.     (all labs ordered are listed, but only abnormal results are displayed) Labs Reviewed  COMPREHENSIVE METABOLIC PANEL WITH GFR - Abnormal; Notable for the following components:      Result Value   Potassium 3.1 (*)    Chloride 95 (*)    CO2 33 (*)    Glucose, Bld 118 (*)    Total Protein 6.4 (*)    Albumin 3.4 (*)    All other components within normal limits  URINALYSIS, ROUTINE W REFLEX MICROSCOPIC - Abnormal; Notable for the following components:   Color, Urine AMBER (*)    APPearance HAZY (*)    Protein, ur 30 (*)    All other components within normal limits  CBC - Abnormal; Notable for the following components:   WBC 17.1 (*)    RBC 4.18 (*)    Hemoglobin 10.6 (*)    HCT 33.1 (*)    MCV 79.2 (*)    MCH 25.4 (*)    All other components within normal limits  TROPONIN T, HIGH SENSITIVITY - Abnormal;  Notable for the following components:   Troponin T High Sensitivity 23 (*)    All other components within normal limits  MAGNESIUM  CBG MONITORING, ED  TROPONIN T, HIGH SENSITIVITY    EKG: EKG Interpretation Date/Time:  Saturday March 25 2024 12:23:53 EST Ventricular Rate:  60 PR Interval:  139 QRS Duration:  98 QT Interval:  440 QTC Calculation: 440 R Axis:   72  Text Interpretation: Sinus rhythm Confirmed by  Elnor Savant ([id number]) on 03/25/2024 1:17:15 PM  Radiology: DG Chest Portable 1 View Result Date: 03/25/2024 EXAM: 1 VIEW(S) XRAY OF THE CHEST 03/25/2024 01:34:00 PM COMPARISON: 04/02/2022 CLINICAL HISTORY: loc FINDINGS: LINES, TUBES AND DEVICES: Right chest Port-A-Cath in place with tip overlying the right atrium approximately 2 cm distal to the superior cavoatrial junction. LUNGS AND PLEURA: Left lung base scarring. No pleural effusion. No pneumothorax. HEART AND MEDIASTINUM: The tip of the right chest Port-A-Cath overlies the right atrium approximately 2 cm distal to the superior cavoatrial junction. No acute abnormality of the cardiac and mediastinal silhouettes. BONES AND SOFT TISSUES: Right chest Port-A-Cath in place. No acute osseous abnormality. IMPRESSION: 1. No acute findings. 2. Right chest Port-A-Cath with tip overlying the right atrium, approximately 2 cm distal to the superior cavoatrial junction. Electronically signed by: Morgane Naveau MD 03/25/2024 01:46 PM EST RP Workstation: [id number]   CT Cervical Spine Wo Contrast Result Date: 03/25/2024 EXAM: CT CERVICAL SPINE WITHOUT CONTRAST 03/25/2024 01:05:37 PM TECHNIQUE: CT of the cervical spine was performed without the administration of intravenous contrast. Multiplanar reformatted images are provided for review. Automated exposure control, iterative reconstruction, and/or weight based adjustment of the mA/kV was utilized to reduce the radiation dose to as low as reasonably achievable. COMPARISON: None available. CLINICAL  HISTORY: fall, neck pain FINDINGS: BONES AND ALIGNMENT: No acute fracture or traumatic malalignment. DEGENERATIVE CHANGES: Mild degenerative changes of the spine. SOFT TISSUES: Subcentimeter calcified hypodense nodule of the right thyroid  gland. Partially visualized central venous catheter. No prevertebral soft tissue swelling. IMPRESSION: 1. No acute cervical spine abnormality. Electronically signed by: Morgane Naveau MD 03/25/2024 01:21 PM EST RP Workstation: [id number]   CT Head Wo Contrast Result Date: 03/25/2024 EXAM: CT HEAD WITHOUT CONTRAST 03/25/2024 01:05:37 PM TECHNIQUE: CT of the head was performed without the administration of intravenous contrast. Automated exposure control, iterative reconstruction, and/or weight based adjustment of the mA/kV was utilized to reduce the radiation dose to as low as reasonably achievable. COMPARISON: None available. CLINICAL HISTORY: fall, head injury FINDINGS: BRAIN AND VENTRICLES: No acute hemorrhage. No evidence of acute infarct. No hydrocephalus. No extra-axial collection. No mass effect or midline shift. Atherosclerotic calcifications are present within the cavernous internal carotid arteries. ORBITS: No acute abnormality. SINUSES: No acute abnormality. SOFT TISSUES AND SKULL: 3 mm left posterior scalp contusion. No skull fracture. IMPRESSION: 1. No acute intracranial abnormality. Electronically signed by: Morgane Naveau MD 03/25/2024 01:17 PM EST RP Workstation: [id number]     Procedures   Medications Ordered in the ED  potassium chloride  SA (KLOR-CON  M) CR tablet 20 mEq (20 mEq Oral Given 03/25/24 1335)  lactated ringers  bolus 1,000 mL (0 mLs Intravenous Stopped 03/25/24 1520)    Clinical Course as of 03/25/24 1927  Sat Mar 25, 2024  1402 WBC 03/21/24 6.6 [CH]  1403 Hemoglobin 03/21/24 12.0 [CH]  1448 Repeat trop [CH]    Clinical Course User Index [CH] Adaliah Hiegel, [place] FALCON, PA-C                                 Medical Decision Making Amount and/or  Complexity of Data Reviewed Labs: ordered. Radiology: ordered.  Risk Prescription drug management.   Patient presents to the ED for concern of dizziness, possible syncopal episode, this involves an extensive number of treatment options, and is a complaint that carries with it a high risk of complications and morbidity.  The differential diagnosis includes dehydration, seizure, anemia, electrolyte  abnormality, UTI, cardiac etiology of syncope, arrythmia, etc.   Co morbidities that complicate the patient evaluation  asthma, low testosterone , GERD, hypertension, diabetes, hyperlipidemia, pancreatic cancer   Additional history obtained:  Reviewed most recent oncology note from 03/21/2024, patient states that he took his pegfilgrastim-jmdb Tura)  shot yesterday, this is most likely the reason for his leukocytosis and possible    Lab Tests:  I Ordered, and personally interpreted labs.  The pertinent results include: White blood cell count 17.1, hemoglobin 10.6, potassium 3.1, urinalysis not consistent with infection, troponin 23, repeat troponin pending   Imaging Studies ordered:  I ordered imaging studies including CT head, CT cervical spine, chest x-ray I independently visualized and interpreted imaging which showed CT head and cervical spine showed no acute muscular skeletal abnormality from the fall, chest x-ray shows no acute finding I agree with the radiologist interpretation   Cardiac Monitoring:  The patient was maintained on a cardiac monitor.  I personally viewed and interpreted the cardiac monitored which showed an underlying rhythm of: Sinus rhythm, no evidence of arrhythmia.   Medicines ordered and prescription drug management:  I ordered medication including fluids and potassium for hypokalemia, possible dehydration Reevaluation of the patient after these medicines showed that the patient improved I have reviewed the patients home medicines and have made  adjustments as needed   Test Considered:  CT PE study: Deferred at this time as patient has no chest pain or shortness of breath, patient talking in full sentences on room air, also no tachycardia, shared decision making with patient regarding CT PE study, patient would like to hold off at this time if repeat troponin is flat   Critical Interventions:  None   Problem List / ED Course:  - 59 year old male, vital signs stable, presents to the emergency department for a chief complaint of syncopal episode, patient has had 2 episodes of dizziness earlier today, diagnosed with pancreatic cancer recently, has struggled with p.o. intake - Did have prodrome of lightheadedness and feeling of off-balance as well as tunnel vision prior to syncope - On physical exam patient is well-appearing, able to sit up on bed, range of motion of all 4 extremities grossly normal, very limited head and neck tenderness, normal neck range of motion - Will image CT head and C spine, obtain basic labs, EKG, troponin, will give fluids and supplement potassium  - Significant lab abnormalities present including leukocytosis and anemia however patient just took stim treatment yesterday, believe this may be cause for lab abnormalities, imaging reassuring with no acute musculoskeletal abnormality, potassium has been low before and was supplemented today, patient also recently started home supplementation, first troponin 23, will repeat and make sure that troponin is flat, EKG reassuring with no evidence of arrhythmia - Given prodrome of tunnel vision and feelings of dizziness prior to syncope, believe most likely diagnosis may be dehydration due to reduced p.o. intake, patient is tolerating p.o. here in the emergency department - Patient signed out to oncoming EDP Darice Showers PA-C pending second troponin, plan is for discharge home if second troponin comes back flat  - Oncoming PA-C assumes care over this patient, further work-up  and ultimately disposition, at this time plan for discharge home pending second trop, most likely diagnosis is dizziness and syncope in the setting of reduced PO intake and dehydration, orthostatic vitals reassuring    Reevaluation:  After the interventions noted above, I reevaluated the patient and found that they have :improved   Social Determinants of  Health:  none   Dispostion:  Patient signed out to oncoming EDP Darice Showers PA-C pending second troponin, plan is for discharge home if second troponin comes back flat  Oncoming PA-C assumes care over this patient, further work-up and ultimately disposition, at this time plan for discharge home pending second trop, most likely diagnosis is dizziness and syncope in the setting of reduced PO intake and dehydration     Final diagnoses:  Dizziness  Syncope, unspecified syncope type    ED Discharge Orders     None          Janetta Terrall FALCON, [place] 03/25/24 1927  "

## 2024-03-25 NOTE — ED Provider Notes (Signed)
 Patient's care assumed at 3:30 PM.  Patient is pending second troponin.  Patient had a syncopal episode today and has been experiencing dizziness at home.  Patient has had a evaluation of his head and neck patient's cervical spine CT and head CT showed no acute findings.   patient's first troponin was elevated to 23.  Patient's second troponin is 16.  Patient is reevaluated and is stable.  Patient is discharged to follow-up with his primary care physician   Aadith Raudenbush K, PA-C 03/25/24 1631    Charlyn Sora, MD 03/26/24 1556

## 2024-03-25 NOTE — ED Triage Notes (Signed)
 Patient reports getting dizzy twice today Fell at store, hit back of head Denies LOC  No thinners  Wife present and states patients SBP was In the 70s when she checked after fall  Patient reports head pain is 3/10

## 2024-03-25 NOTE — Discharge Instructions (Addendum)
 Return if any problems.  See your physician for recheck. This week.

## 2024-05-02 IMAGING — DX DG CHEST 2V
2 series · 2 of 2 positions shown · non-contrast
Comparison: 02/12/2014

CLINICAL DATA: Chest pain.  Shortness of breath

EXAM:
CHEST - 2 VIEW

[chest pa]
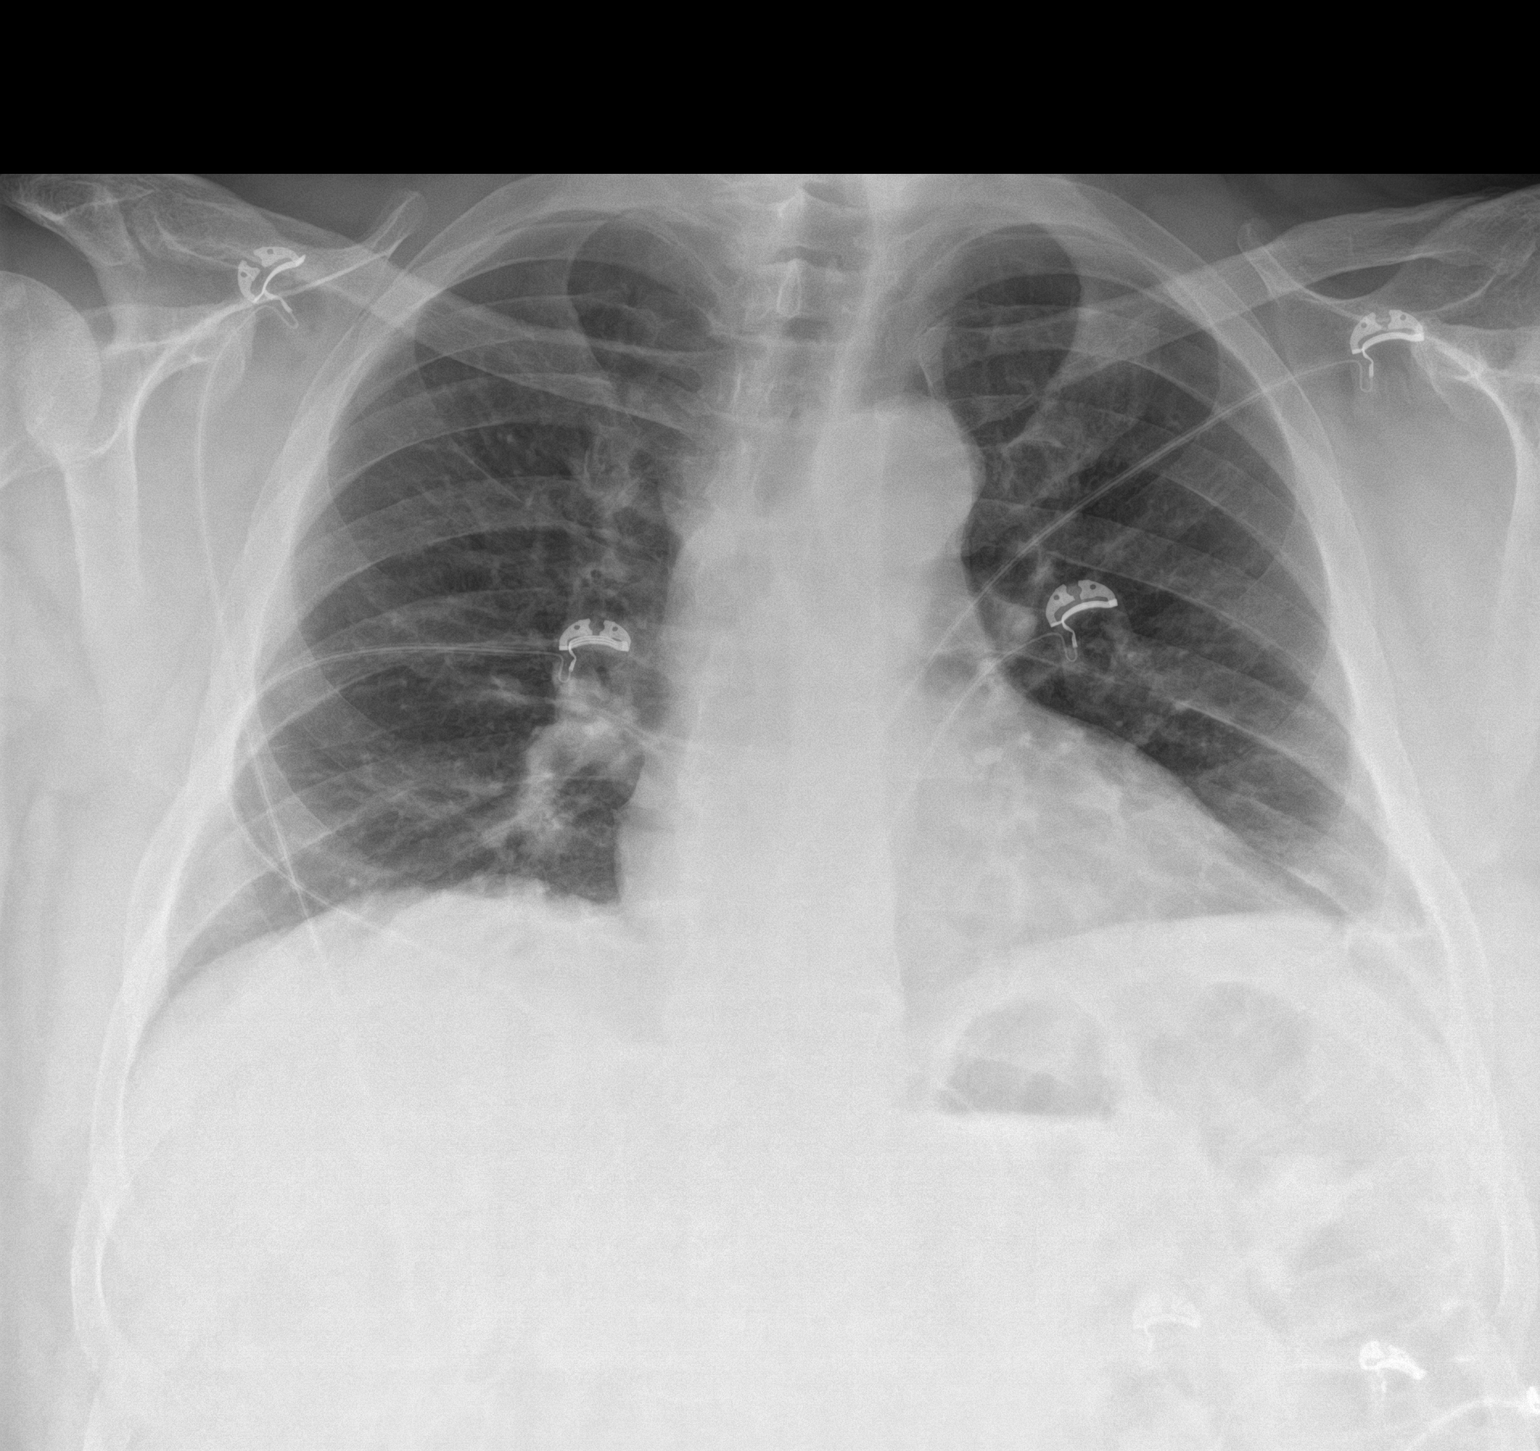

[chest lat]
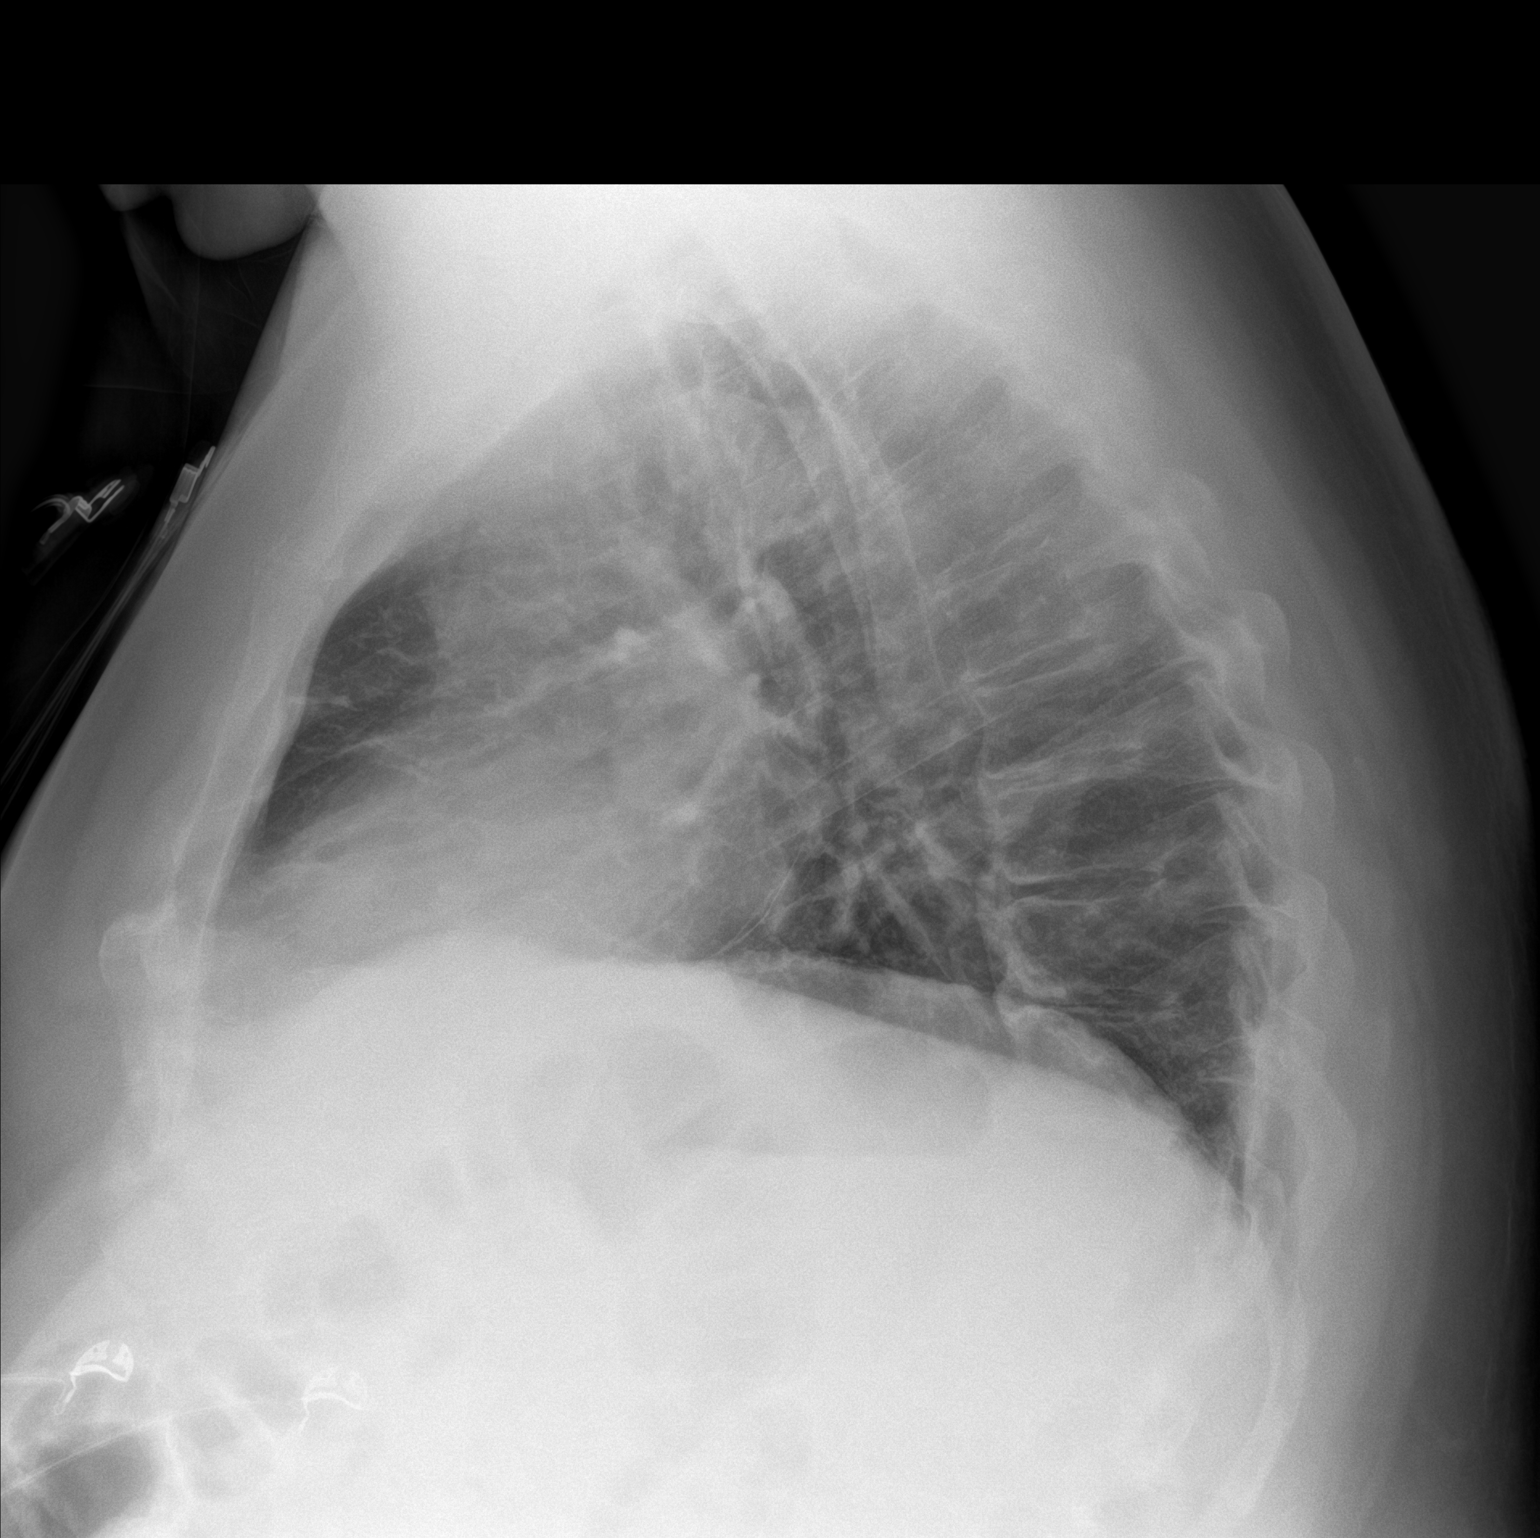

[2 of 2 positions shown; findings below may reference images not displayed]

FINDINGS: Lung volumes are low.The cardiomediastinal contours are normal.
Subsegmental atelectasis in the lung bases. Pulmonary vasculature is
normal. No pleural effusion or pneumothorax. No acute osseous
abnormalities are seen.
IMPRESSION: Low lung volumes with bibasilar atelectasis.
